# Patient Record
Sex: Male | Born: 1980 | Race: White | Hispanic: No | Marital: Married | Smoking: Former smoker
Health system: Southern US, Community
[De-identification: ages and names within clinical notes are randomized; demographics above are authoritative.]

## PROBLEM LIST (undated history)

## (undated) DIAGNOSIS — J45909 Unspecified asthma, uncomplicated: Secondary | ICD-10-CM

## (undated) DIAGNOSIS — N200 Calculus of kidney: Secondary | ICD-10-CM

---

## 2007-04-12 ENCOUNTER — Emergency Department (HOSPITAL_COMMUNITY): Admission: EM | Admit: 2007-04-12 | Discharge: 2007-04-13 | Payer: Self-pay | Admitting: Emergency Medicine

## 2007-04-14 ENCOUNTER — Emergency Department (HOSPITAL_COMMUNITY): Admission: EM | Admit: 2007-04-14 | Discharge: 2007-04-14 | Payer: Self-pay | Admitting: Emergency Medicine

## 2007-05-29 ENCOUNTER — Emergency Department (HOSPITAL_COMMUNITY): Admission: EM | Admit: 2007-05-29 | Discharge: 2007-05-29 | Payer: Self-pay | Admitting: Emergency Medicine

## 2008-02-08 ENCOUNTER — Emergency Department (HOSPITAL_COMMUNITY): Admission: EM | Admit: 2008-02-08 | Discharge: 2008-02-08 | Payer: Self-pay | Admitting: Emergency Medicine

## 2009-06-23 ENCOUNTER — Emergency Department (HOSPITAL_COMMUNITY): Admission: EM | Admit: 2009-06-23 | Discharge: 2009-06-23 | Payer: Self-pay | Admitting: Emergency Medicine

## 2009-07-06 ENCOUNTER — Emergency Department (HOSPITAL_COMMUNITY): Admission: EM | Admit: 2009-07-06 | Discharge: 2009-07-06 | Payer: Self-pay | Admitting: Emergency Medicine

## 2010-06-03 ENCOUNTER — Emergency Department (HOSPITAL_COMMUNITY): Payer: Self-pay

## 2010-06-03 ENCOUNTER — Emergency Department (HOSPITAL_COMMUNITY)
Admission: EM | Admit: 2010-06-03 | Discharge: 2010-06-03 | Disposition: A | Discharge status: Home / Self Care | Payer: Self-pay | Attending: Emergency Medicine | Admitting: Emergency Medicine

## 2010-06-03 DIAGNOSIS — R0602 Shortness of breath: Secondary | ICD-10-CM | POA: Insufficient documentation

## 2010-06-03 DIAGNOSIS — F411 Generalized anxiety disorder: Secondary | ICD-10-CM | POA: Insufficient documentation

## 2010-11-25 LAB — DIFFERENTIAL
Basophils Absolute: 0
Basophils Relative: 0
Eosinophils Absolute: 0.1
Eosinophils Relative: 1
Neutro Abs: 6.9

## 2010-11-25 LAB — URINALYSIS, ROUTINE W REFLEX MICROSCOPIC
Glucose, UA: 100 — AB
Hgb urine dipstick: NEGATIVE
Protein, ur: NEGATIVE

## 2010-11-25 LAB — COMPREHENSIVE METABOLIC PANEL
Albumin: 3.7
BUN: 15
Calcium: 9
Creatinine, Ser: 0.85
GFR calc Af Amer: >60
GFR calc non Af Amer: >60
Potassium: 4.3
Sodium: 138
Total Bilirubin: 0.9

## 2010-11-25 LAB — CBC
MCHC: 35.2
Platelets: 199
RBC: 4.76
RDW: 11.5

## 2010-11-28 LAB — URINALYSIS, ROUTINE W REFLEX MICROSCOPIC
Glucose, UA: NEGATIVE
pH: 7

## 2010-11-28 LAB — URINE MICROSCOPIC-ADD ON

## 2011-02-19 ENCOUNTER — Emergency Department (HOSPITAL_COMMUNITY)
Admission: EM | Admit: 2011-02-19 | Discharge: 2011-02-20 | Disposition: A | Discharge status: Home / Self Care | Payer: Self-pay | Attending: Emergency Medicine | Admitting: Emergency Medicine

## 2011-02-19 DIAGNOSIS — R109 Unspecified abdominal pain: Secondary | ICD-10-CM | POA: Insufficient documentation

## 2011-02-19 DIAGNOSIS — R300 Dysuria: Secondary | ICD-10-CM | POA: Insufficient documentation

## 2011-02-19 DIAGNOSIS — R110 Nausea: Secondary | ICD-10-CM | POA: Insufficient documentation

## 2011-02-19 DIAGNOSIS — R319 Hematuria, unspecified: Secondary | ICD-10-CM | POA: Insufficient documentation

## 2011-02-19 DIAGNOSIS — R1032 Left lower quadrant pain: Secondary | ICD-10-CM | POA: Insufficient documentation

## 2011-02-19 HISTORY — DX: Calculus of kidney: N20.0

## 2011-02-19 NOTE — ED Notes (Signed)
Pt reports left side flank pain off and on for the past few days.  Pt has hx of kidney stones and states "it feels the same".  Pt denies any hematuria or dysuria.

## 2011-02-20 LAB — URINE MICROSCOPIC-ADD ON

## 2011-02-20 LAB — URINALYSIS, ROUTINE W REFLEX MICROSCOPIC
Ketones, ur: NEGATIVE mg/dL
Leukocytes, UA: NEGATIVE
Protein, ur: NEGATIVE mg/dL
Specific Gravity, Urine: >1.03 — ABNORMAL HIGH (ref 1.005–1.030)

## 2011-02-20 MED ORDER — PROMETHAZINE HCL 25 MG PO TABS: 25.0000 mg | ORAL_TABLET | Freq: Four times a day (QID) | ORAL | Status: AC | PRN

## 2011-02-20 MED ORDER — HYDROMORPHONE HCL PF 1 MG/ML IJ SOLN
1.0000 mg | Freq: Once | INTRAMUSCULAR | Status: AC
  Administered 2011-02-20: 1 mg via INTRAVENOUS
  Filled 2011-02-20: qty 1

## 2011-02-20 MED ORDER — SODIUM CHLORIDE 0.9 % IV SOLN
Freq: Once | INTRAVENOUS | Status: AC
  Administered 2011-02-20: 01:00:00 via INTRAVENOUS

## 2011-02-20 MED ORDER — OXYCODONE-ACETAMINOPHEN 5-325 MG PO TABS: 1.0000 | ORAL_TABLET | ORAL | Status: AC | PRN

## 2011-02-20 MED ORDER — ONDANSETRON HCL 4 MG/2ML IJ SOLN
4.0000 mg | Freq: Once | INTRAMUSCULAR | Status: AC
  Administered 2011-02-20: 4 mg via INTRAVENOUS
  Filled 2011-02-20: qty 2

## 2011-02-20 NOTE — ED Provider Notes (Signed)
Medical screening examination/treatment/procedure(s) were performed by non-physician practitioner and as supervising physician I was immediately available for consultation/collaboration.  Flint Melter, MD 02/20/11 2116

## 2011-02-20 NOTE — Discharge Instructions (Signed)
Flank Pain Flank pain refers to pain that is located on the side of the body between the upper abdomen and the back. It can be caused by many things. CAUSES  Some of the more common causes of flank pain include:  Muscle strain.   Muscle spasms.   A disease of your spine (vertebral disk disease).   A lung infection (pneumonia).   Fluid around your lungs (pulmonary edema).   A kidney infection.   Kidney stones.   A very painful skin rash on only one side of your body (shingles).   Gallbladder disease.  DIAGNOSIS  Blood tests, urine tests, and X-rays may help your caregiver determine what is wrong. TREATMENT  The treatment of pain depends on the cause. Your caregiver will determine what treatment will work best for you. HOME CARE INSTRUCTIONS   Home care will depend on the cause of your pain.   Some medications may help relieve the pain. Take medication for relief of pain as directed by your caregiver.   Tell your caregiver about any changes in your pain.   Follow up with your caregiver.  SEEK IMMEDIATE MEDICAL CARE IF:   Your pain is not controlled with medication.   The pain increases.   You have abdominal pain.   You have shortness of breath.   You have persistent nausea or vomiting.   You have swelling in your abdomen.   You feel faint or pass out.   You have a temperature by mouth above 102 F (38.9 C), not controlled by medicine.  MAKE SURE YOU:   Understand these instructions.   Will watch your condition.   Will get help right away if you are not doing well or get worse.  Document Released: 04/13/2005 Document Revised: 11/02/2010 Document Reviewed: 08/07/2009 [facility] Patient Information 2012 [place], [place].Hematuria, Adult Hematuria (blood in your urine) can be caused by a bladder infection (cystitis), kidney infection (pyelonephritis), prostate infection (prostatitis), or kidney stone. Infections will usually respond to antibiotics (medications  which kill germs), and a kidney stone will usually pass through your urine without further treatment. If you were put on antibiotics, take all the medicine until gone. You may feel better in a few days, but take all of your medicine or the infection may not respond and become more difficult to treat. If antibiotics were not given, an infection did not cause the blood in the urine. A further work up to find out the reason may be needed. HOME CARE INSTRUCTIONS   Drink lots of fluid, 3 to 4 quarts a day. If you have been diagnosed with an infection, cranberry juice is especially recommended, in addition to large amounts of water.   Avoid caffeine, tea, and carbonated beverages, because they tend to irritate the bladder.   Avoid alcohol as it may irritate the prostate.   Only take over-the-counter or prescription medicines for pain, discomfort, or fever as directed by your caregiver.   If you have been diagnosed with a kidney stone follow your caregivers instructions regarding straining your urine to catch the stone.  TO PREVENT FURTHER INFECTIONS:  Empty the bladder often. Avoid holding urine for long periods of time.   After a bowel movement, women should cleanse front to back. Use each tissue only once.   Empty the bladder before and after sexual intercourse if you are a male.   Return to your caregiver if you develop back pain, fever, nausea (feeling sick to your stomach), vomiting, or your symptoms (problems) are  not better in 3 days. Return sooner if you are getting worse.  If you have been requested to return for further testing make sure to keep your appointments. If an infection is not the cause of blood in your urine, X-rays may be required. Your caregiver will discuss this with you. SEEK IMMEDIATE MEDICAL CARE IF:   You have a persistent fever over 102 F (38.9 C).   You develop severe vomiting and are unable to keep the medication down.   You develop severe back or abdominal  pain despite taking your medications.   You begin passing a large amount of blood or clots in your urine.   You feel extremely weak or faint, or pass out.  MAKE SURE YOU:   Understand these instructions.   Will watch your condition.   Will get help right away if you are not doing well or get worse.  Document Released: 02/20/2005 Document Revised: 11/02/2010 Document Reviewed: 10/10/2007 [facility] Patient Information 2012 [place], [place].   As discussed,  I suspect you have a kidney stone.  Return for any worsened symptoms as discussed.  Call Dr Jerre Simon for further management of this condition.  You may take the oxycodone prescribed for pain relief.  This will make you drowsy - do not drive within 4 hours of taking this medication.

## 2011-02-20 NOTE — ED Provider Notes (Signed)
History     CSN: [id number] Arrival date & time: 02/19/2011 11:29 PM   First MD Initiated Contact with Patient 02/19/11 2349      Chief Complaint  Patient presents with  . Flank Pain    (Consider location/radiation/quality/duration/timing/severity/associated sxs/prior treatment) Patient is a 30 y.o. male presenting with flank pain. The history is provided by the patient.  Flank Pain This is a recurrent problem. Episode onset: 3 days ago. The problem occurs intermittently. The problem has been unchanged. Associated symptoms include nausea. Pertinent negatives include no abdominal pain, arthralgias, chest pain, chills, congestion, fever, headaches, joint swelling, neck pain, numbness, rash, sore throat, vomiting or weakness. Associated symptoms comments: He denies hematuria,  But does report occasional increased pain with urination.  Has a history of kidney stones,  Which resemble his symptoms today . The symptoms are aggravated by nothing. He has tried nothing for the symptoms.    Past Medical History  Diagnosis Date  . Kidney stones     History reviewed. No pertinent past surgical history.  No family history on file.  History  Substance Use Topics  . Smoking status: Never Smoker   . Smokeless tobacco: Not on file  . Alcohol Use: Yes      Review of Systems  Constitutional: Negative for fever and chills.  HENT: Negative for congestion, sore throat and neck pain.   Eyes: Negative.   Respiratory: Negative for chest tightness and shortness of breath.   Cardiovascular: Negative for chest pain.  Gastrointestinal: Positive for nausea. Negative for vomiting and abdominal pain.  Genitourinary: Positive for dysuria and flank pain. Negative for urgency, decreased urine volume and discharge.  Musculoskeletal: Negative for joint swelling and arthralgias.  Skin: Negative.  Negative for rash and wound.  Neurological: Negative for dizziness, weakness, light-headedness, numbness and  headaches.  Hematological: Negative.   Psychiatric/Behavioral: Negative.     Allergies  Review of patient's allergies indicates no known allergies.  Home Medications   Current Outpatient Rx  Name Route Sig Dispense Refill  . OXYCODONE-ACETAMINOPHEN 5-325 MG PO TABS Oral Take 1 tablet by mouth every 4 (four) hours as needed for pain. 20 tablet 0  . PROMETHAZINE HCL 25 MG PO TABS Oral Take 1 tablet (25 mg total) by mouth every 6 (six) hours as needed for nausea. 12 tablet 0    BP 132/84  Pulse 70  Temp(Src) 97.6 F (36.4 C) (Oral)  Resp 16  Ht 5\' 7"  (1.702 m)  Wt 202 lb (91.627 kg)  BMI 31.64 kg/m2  SpO2 97%  Physical Exam  Nursing note and vitals reviewed. Constitutional: He is oriented to person, place, and time. He appears well-developed and well-nourished.  HENT:  Head: Normocephalic and atraumatic.  Eyes: Conjunctivae are normal.  Neck: Normal range of motion.  Cardiovascular: Normal rate, regular rhythm, normal heart sounds and intact distal pulses.   Pulmonary/Chest: Effort normal and breath sounds normal. He has no wheezes.  Abdominal: Soft. Bowel sounds are normal. There is tenderness. There is CVA tenderness.       Left cva tenderness,  No abdominal tenderness.  Pain radiates to left lower abdomen, not worsened with palpation.  Musculoskeletal: Normal range of motion.  Neurological: He is alert and oriented to person, place, and time.  Skin: Skin is warm and dry.  Psychiatric: He has a normal mood and affect.    ED Course  Procedures (including critical care time)  Labs Reviewed  URINALYSIS, ROUTINE W REFLEX MICROSCOPIC - Abnormal; Notable for the  following:    Specific Gravity, Urine >1.030 (*)    Hgb urine dipstick LARGE (*)    All other components within normal limits  URINE MICROSCOPIC-ADD ON - Abnormal; Notable for the following:    Bacteria, UA FEW (*)    All other components within normal limits  LAB REPORT - SCANNED   No results found.   1.  Hematuria   2. Flank pain       MDM  Reviewed previous Ct scan - no kidney stones,  But slight hydro suggestive of recent passage of stone.  Discussed case with Dr. Effie Shy,  No indication to repeat CT scan today. Patient encouraged to strain urine,  Pain and nausea medication given.  Strongly warned to return here if he develops worse pain,  Fever or vomiting.  Also given a referral to Dr. Jerre Simon.        Candis Musa, PA 02/20/11 1415

## 2011-02-20 NOTE — ED Notes (Signed)
Pt alert and oriented with respirations even and unlabored.  NAD at this time.  Discharge instructions and Rx x 2 reviewed with pt and pt verbalized understanding.  Pt ambulated with steady gait to POV.  Patient's wife to transport pt home.

## 2012-05-01 ENCOUNTER — Encounter (HOSPITAL_COMMUNITY): Payer: Self-pay | Admitting: Emergency Medicine

## 2012-05-01 ENCOUNTER — Emergency Department (HOSPITAL_COMMUNITY)
Admission: EM | Admit: 2012-05-01 | Discharge: 2012-05-02 | Disposition: A | Discharge status: Home / Self Care | Payer: Self-pay | Attending: Emergency Medicine | Admitting: Emergency Medicine

## 2012-05-01 DIAGNOSIS — H539 Unspecified visual disturbance: Secondary | ICD-10-CM | POA: Insufficient documentation

## 2012-05-01 DIAGNOSIS — Z87442 Personal history of urinary calculi: Secondary | ICD-10-CM | POA: Insufficient documentation

## 2012-05-01 DIAGNOSIS — T1590XA Foreign body on external eye, part unspecified, unspecified eye, initial encounter: Secondary | ICD-10-CM | POA: Insufficient documentation

## 2012-05-01 DIAGNOSIS — T1500XA Foreign body in cornea, unspecified eye, initial encounter: Secondary | ICD-10-CM | POA: Insufficient documentation

## 2012-05-01 DIAGNOSIS — Y9389 Activity, other specified: Secondary | ICD-10-CM | POA: Insufficient documentation

## 2012-05-01 DIAGNOSIS — H5789 Other specified disorders of eye and adnexa: Secondary | ICD-10-CM | POA: Insufficient documentation

## 2012-05-01 DIAGNOSIS — J45909 Unspecified asthma, uncomplicated: Secondary | ICD-10-CM | POA: Insufficient documentation

## 2012-05-01 DIAGNOSIS — IMO0002 Reserved for concepts with insufficient information to code with codable children: Secondary | ICD-10-CM | POA: Insufficient documentation

## 2012-05-01 DIAGNOSIS — Y929 Unspecified place or not applicable: Secondary | ICD-10-CM | POA: Insufficient documentation

## 2012-05-01 HISTORY — DX: Unspecified asthma, uncomplicated: J45.909

## 2012-05-01 MED ORDER — TETRACAINE HCL 0.5 % OP SOLN
1.0000 [drp] | Freq: Once | OPHTHALMIC | Status: AC
  Administered 2012-05-01: 1 [drp] via OPHTHALMIC
  Filled 2012-05-01: qty 2

## 2012-05-01 MED ORDER — FLUORESCEIN SODIUM 1 MG OP STRP
ORAL_STRIP | OPHTHALMIC | Status: AC
  Administered 2012-05-02
  Filled 2012-05-01: qty 1

## 2012-05-01 NOTE — ED Notes (Signed)
Patient reports was using a grinding tool to cut through metal, reports noticed eye was itching once he was finished. Reports he started noticing vision was blurry in left eye. Reports he also feels foreign body in eye.

## 2012-05-01 NOTE — ED Provider Notes (Signed)
History     CSN: [id number]  Arrival date & time 05/01/12  2249   None     Chief Complaint  Patient presents with  . Eye Injury  . Foreign Body in Eye    (Consider location/radiation/quality/duration/timing/severity/associated sxs/prior Treatment)  HPI Ronald Randall is a 32 y.o. male who presents to the ED with foreign body to the left eye. He state that he was grinding metal today and something flew in eye. He was able to see what he thinks is a piece of metal in his eye. He describes the pain in the left eye as gravitating. He was wearing his safety glasses. He denies any other problems.    Past Medical History  Diagnosis Date  . Kidney stones   . Asthma     History reviewed. No pertinent past surgical history.  History reviewed. No pertinent family history.  History  Substance Use Topics  . Smoking status: Never Smoker   . Smokeless tobacco: Not on file  . Alcohol Use: Yes      Review of Systems  Constitutional: Negative for fever, chills and activity change.  HENT: Negative for facial swelling.   Eyes: Positive for pain, redness and visual disturbance.  Respiratory: Negative for cough and wheezing.   Cardiovascular: Negative for chest pain.  Gastrointestinal: Negative for abdominal pain.  Genitourinary: Negative for frequency.  Musculoskeletal: Negative for back pain.  Skin: Negative for rash.  Allergic/Immunologic: Negative for immunocompromised state.  Psychiatric/Behavioral: Negative for confusion. The patient is not nervous/anxious.     Allergies  Review of patient's allergies indicates no known allergies.  Home Medications  No current outpatient prescriptions on file.  BP 158/81  Pulse 88  Temp(Src) 98 F (36.7 C) (Oral)  Resp 18  Ht 5\' 7"  (1.702 m)  Wt 202 lb (91.627 kg)  BMI 31.63 kg/m2  SpO2 100%  Physical Exam  Nursing note and vitals reviewed. Constitutional: He is oriented to person, place, and time. He appears well-developed and  well-nourished.  HENT:  Head: Normocephalic and atraumatic.  Eyes: EOM are normal. Pupils are equal, round, and reactive to light. Foreign body present in the left eye. Left conjunctiva is injected.  Slit lamp exam:      The left eye shows corneal abrasion, foreign body and fluorescein uptake.    Several tiny slivers noted on cornea.  Neck: Neck supple.  Cardiovascular: Normal rate.   Pulmonary/Chest: Effort normal. No respiratory distress.  Musculoskeletal: Normal range of motion. He exhibits no edema.  Neurological: He is alert and oriented to person, place, and time. No cranial nerve deficit.  Skin: Skin is warm and dry.  Psychiatric: He has a normal mood and affect. His behavior is normal. Judgment and thought content normal.   Procedures After anesthetizing with tetracaine and staining with flori-strip, using a sterile cotton tip applicator Dr. Patria Mane was able to remove the foreign bodies from the cornea.  Patient tolerated the procedure well.   Assessment: 32 y.o. male with foreign bodies to the left eye   Corneal abrasion   Plan:  Pain management   Tobramycin opth drops   Follow up with Opthalmology Discussed with the patient and all questioned fully answered. He will return if any problems arise.    Medication List    TAKE these medications       oxyCODONE-acetaminophen 5-325 MG per tablet  Commonly known as:  PERCOCET/ROXICET  Take 1 tablet by mouth every 4 (four) hours as needed for pain.  [address] [place], [place] 05/02/12 [phone]

## 2012-05-02 MED ORDER — OXYCODONE-ACETAMINOPHEN 5-325 MG PO TABS: 1.0000 | ORAL_TABLET | ORAL | Status: DC | PRN

## 2012-05-02 MED ORDER — TOBRAMYCIN 0.3 % OP SOLN
2.0000 [drp] | OPHTHALMIC | Status: DC
  Administered 2012-05-02: 2 [drp] via OPHTHALMIC
  Filled 2012-05-02: qty 5

## 2012-05-02 NOTE — ED Provider Notes (Signed)
Medical screening examination/treatment/procedure(s) were performed by non-physician practitioner and as supervising physician I was immediately available for consultation/collaboration.   Ronald Randall. Oletta Lamas, MD 05/02/12 9811

## 2012-08-18 ENCOUNTER — Encounter (HOSPITAL_COMMUNITY)
Admission: EM | Disposition: A | Discharge status: Home / Self Care | Payer: Self-pay | Source: Home / Self Care | Attending: Emergency Medicine

## 2012-08-18 ENCOUNTER — Encounter (HOSPITAL_COMMUNITY): Payer: Self-pay | Admitting: Anesthesiology

## 2012-08-18 ENCOUNTER — Emergency Department (HOSPITAL_COMMUNITY): Payer: Self-pay | Admitting: Anesthesiology

## 2012-08-18 ENCOUNTER — Encounter (HOSPITAL_COMMUNITY): Payer: Self-pay | Admitting: Emergency Medicine

## 2012-08-18 ENCOUNTER — Emergency Department (HOSPITAL_COMMUNITY)
Admission: EM | Admit: 2012-08-18 | Discharge: 2012-08-19 | Disposition: A | Discharge status: Home / Self Care | Payer: Self-pay | Attending: Internal Medicine | Admitting: Internal Medicine

## 2012-08-18 DIAGNOSIS — K449 Diaphragmatic hernia without obstruction or gangrene: Secondary | ICD-10-CM

## 2012-08-18 DIAGNOSIS — K2270 Barrett's esophagus without dysplasia: Secondary | ICD-10-CM | POA: Insufficient documentation

## 2012-08-18 DIAGNOSIS — T18108A Unspecified foreign body in esophagus causing other injury, initial encounter: Secondary | ICD-10-CM

## 2012-08-18 DIAGNOSIS — K208 Other esophagitis: Secondary | ICD-10-CM

## 2012-08-18 DIAGNOSIS — K2960 Other gastritis without bleeding: Secondary | ICD-10-CM

## 2012-08-18 DIAGNOSIS — IMO0002 Reserved for concepts with insufficient information to code with codable children: Secondary | ICD-10-CM | POA: Insufficient documentation

## 2012-08-18 DIAGNOSIS — K2980 Duodenitis without bleeding: Secondary | ICD-10-CM | POA: Insufficient documentation

## 2012-08-18 HISTORY — PX: ESOPHAGOGASTRODUODENOSCOPY (EGD) WITH PROPOFOL: SHX5813

## 2012-08-18 HISTORY — PX: FOREIGN BODY REMOVAL: SHX962

## 2012-08-18 SURGERY — REMOVAL, FOREIGN BODY
Anesthesia: Monitor Anesthesia Care

## 2012-08-18 SURGERY — REMOVAL, FOREIGN BODY
Anesthesia: Moderate Sedation

## 2012-08-18 SURGERY — ESOPHAGOGASTRODUODENOSCOPY (EGD) WITH PROPOFOL
Anesthesia: Monitor Anesthesia Care

## 2012-08-18 MED ORDER — LIDOCAINE HCL (CARDIAC) 10 MG/ML IV SOLN
INTRAVENOUS | Status: DC | PRN
  Administered 2012-08-18: 50 mg via INTRAVENOUS

## 2012-08-18 MED ORDER — OMEPRAZOLE MAGNESIUM 20 MG PO TBEC: 20.0000 mg | DELAYED_RELEASE_TABLET | Freq: Two times a day (BID) | ORAL | Status: DC

## 2012-08-18 MED ORDER — FENTANYL CITRATE 0.05 MG/ML IJ SOLN
INTRAMUSCULAR | Status: DC | PRN
  Administered 2012-08-18: 25 ug via INTRAVENOUS

## 2012-08-18 MED ORDER — MEPERIDINE HCL 25 MG/ML IJ SOLN
INTRAMUSCULAR | Status: DC | PRN
  Administered 2012-08-18 (×2): 25 mg via INTRAVENOUS

## 2012-08-18 MED ORDER — BUTAMBEN-TETRACAINE-BENZOCAINE 2-2-14 % EX AERO
INHALATION_SPRAY | CUTANEOUS | Status: DC | PRN
  Administered 2012-08-18 (×2): 2 via TOPICAL

## 2012-08-18 MED ORDER — STERILE WATER FOR IRRIGATION IR SOLN
Status: DC | PRN
  Administered 2012-08-18: 22:00:00

## 2012-08-18 MED ORDER — PROMETHAZINE HCL 25 MG/ML IJ SOLN
INTRAMUSCULAR | Status: DC | PRN
  Administered 2012-08-18: 25 mg via INTRAVENOUS

## 2012-08-18 MED ORDER — LACTATED RINGERS IV SOLN
INTRAVENOUS | Status: DC | PRN
  Administered 2012-08-18: 23:00:00 via INTRAVENOUS

## 2012-08-18 MED ORDER — GLUCAGON HCL (RDNA) 1 MG IJ SOLR
1.0000 mg | Freq: Once | INTRAMUSCULAR | Status: AC
  Administered 2012-08-18: 1 mg via INTRAVENOUS
  Filled 2012-08-18: qty 1

## 2012-08-18 MED ORDER — MIDAZOLAM HCL 5 MG/5ML IJ SOLN
INTRAMUSCULAR | Status: DC | PRN
  Administered 2012-08-18: 3 mg via INTRAVENOUS
  Administered 2012-08-18: 2 mg via INTRAVENOUS
  Administered 2012-08-18: 3 mg via INTRAVENOUS
  Administered 2012-08-18: 2 mg via INTRAVENOUS

## 2012-08-18 MED ORDER — SODIUM CHLORIDE 0.9 % IV SOLN
INTRAVENOUS | Status: DC
  Administered 2012-08-18: 20:00:00 via INTRAVENOUS

## 2012-08-18 MED ORDER — PROPOFOL INFUSION 10 MG/ML OPTIME
INTRAVENOUS | Status: DC | PRN
  Administered 2012-08-18: 75 ug/kg/min via INTRAVENOUS

## 2012-08-18 SURGICAL SUPPLY — 9 items
BLOCK BITE 60FR ADLT L/F BLUE (MISCELLANEOUS) ×2 IMPLANT
FLOOR PAD 36X40 (MISCELLANEOUS) ×2
MANIFOLD NEPTUNE II (INSTRUMENTS) ×2 IMPLANT
PAD FLOOR 36X40 (MISCELLANEOUS) ×1 IMPLANT
SNARE ROTATE MED OVAL 20MM (MISCELLANEOUS) ×2 IMPLANT
SPONGE GAUZE 4X4 12PLY (GAUZE/BANDAGES/DRESSINGS) ×2 IMPLANT
TUBING ENDO SMARTCAP PENTAX (MISCELLANEOUS) ×2 IMPLANT
TUBING IRRIGATION ENDOGATOR (MISCELLANEOUS) ×2 IMPLANT
WATER STERILE IRR 1000ML POUR (IV SOLUTION) ×2 IMPLANT

## 2012-08-18 NOTE — Op Note (Signed)
EGD PROCEDURE REPORT  PATIENT:  Ronald Randall  MR#:  [id number] Birthdate:  01/31/1981, 32 y.o., male Endoscopist:  Dr. Malissa Hippo, MD Referred By:  Dr. Samuel Jester, MD Procedure Date: 08/18/2012  Procedure:   EGD  Indications:  Patient is 32 year old Caucasian male who presents with signs and symptoms of esophageal foreign body. He also reports chronic heartburn. He has had intermittent dysphagia for several years.           Informed Consent:  The risks, benefits, alternatives & imponderables which include, but are not limited to, bleeding, infection, perforation, drug reaction and potential missed lesion have been reviewed.  The potential for biopsy, lesion removal, esophageal dilation, etc. have also been discussed.  Questions have been answered.  All parties agreeable.  Please see history & physical in medical record for more information.  Medications:  Demerol 50 mg IV Versed 10 mg IV Cetacaine spray topically for oropharyngeal anesthesia Propofol administered by anesthesia team.  Description of procedure: The procedure was initially attempted an endoscopy suite following conscious sedation but no sooner the scope was passed into the esophagus patient became restless and combative and procedure could not be completed. He had air fluid level in in the esophagus indicative of distal obstruction. Procedure was then completed with propofol administered under supervision of anesthesia team. Findings:  Esophagus:  Mucosa of the proximal segment was normal. Extensive esophageal ulceration noted starting at 24 cm with luminal narrowing but no resistance noted on passing the scope. There was tubular segment of salmon-colored mucosa least 3 cm in length. There was small amount of food debris in the distal esophagus which passed distally spontaneously. GEJ:  35 cm Hiatus:  38 cm Stomach:  Small amount of food debris noted in the gastric body. Stomach distended very well with insufflation.  Folds in the proximal stomach were normal. Examination mucosa at body, antrum, pyloric channel, angularis fundus and cardia was normal. Duodenum:  Multiple bulbar erosions noted. Post bulbar mucosa was normal.  Therapeutic/Diagnostic Maneuvers Performed:  None  Complications:  None  Impression: Foreign body spontaneously moved distally into the stomach. Extensive ulceration to mucosa admitted esophagus with no luminal narrowing. Barrett's mucosa. Length could not be estimated because of ulceration. Small sliding hiatal hernia. Erosive bulbar duodenitis  Recommendations:  H. pylori serology. Prilosec OTC 20 mg by mouth twice a day. Office visit in one month. He will need elective EGD with biopsy under endotracheal anesthesia in 8-12 weeks.  REHMAN,NAJEEB U  08/18/2012  11:42 PM  CC: Dr. No primary provider on file. & Dr. No ref. provider found

## 2012-08-18 NOTE — Anesthesia Postprocedure Evaluation (Signed)
  Anesthesia Post-op Note  Patient: Ronald Randall  Procedure(s) Performed: Procedure(s): ESOPHAGOGASTRODUODENOSCOPY (EGD) WITH PROPOFOL (N/A) FOREIGN BODY REMOVAL (N/A)  Patient Location: PACU  Anesthesia Type:MAC  Level of Consciousness: awake, alert , oriented and patient cooperative  Airway and Oxygen Therapy: Patient Spontanous Breathing  Post-op Pain: mild  Post-op Assessment: Post-op Vital signs reviewed, Patient's Cardiovascular Status Stable, Respiratory Function Stable, Patent Airway and No signs of Nausea or vomiting  Post-op Vital Signs: Reviewed and stable  Complications: No apparent anesthesia complications

## 2012-08-18 NOTE — Anesthesia Procedure Notes (Signed)
Procedure Name: MAC Date/Time: 08/18/2012 11:49 PM Performed by: Carolyne Littles, AMY L Pre-anesthesia Checklist: Patient identified, Emergency Drugs available, Suction available, Patient being monitored and Timeout performed Oxygen Delivery Method: Non-rebreather mask

## 2012-08-18 NOTE — ED Notes (Signed)
Pt continues to spit saliva into emesis bag. Respiratory status remains stable.

## 2012-08-18 NOTE — Transfer of Care (Signed)
Immediate Anesthesia Transfer of Care Note  Patient: Ronald Randall  Procedure(s) Performed: Procedure(s): ESOPHAGOGASTRODUODENOSCOPY (EGD) WITH PROPOFOL (N/A) FOREIGN BODY REMOVAL (N/A)  Patient Location: PACU  Anesthesia Type:MAC  Level of Consciousness: sedated and patient cooperative  Airway & Oxygen Therapy: Patient Spontanous Breathing  Post-op Assessment: Report given to PACU RN and Post -op Vital signs reviewed and stable  Post vital signs: Reviewed and stable  Complications: No apparent anesthesia complications

## 2012-08-18 NOTE — ED Provider Notes (Signed)
History     CSN: [id number]  Arrival date & time 08/18/12  1858   First MD Initiated Contact with Patient 08/18/12 1902      Chief Complaint  Patient presents with  . Swallowed Foreign Body     HPI Pt was seen at 1910. Per pt, c/o sudden onset and persistence of constant esophageal FB that began approx 1 hour PTA. Pt states he was eating a piece of steak when he felt like it became "stuck" in his lower throat.  Pt states he has been unable to swallow his oral secretions or water without regurgitating them. Pt states has a hx of similar symptoms for the past "14 years." Pt states he "usually can just push the food down by drinking a lot of water."  Pt states he has never been eval by a GI MD for same. Denies SOB, no wheezing/stridor, no CP, no abd pain, no hematemesis.       Past Medical History  Diagnosis Date  . Kidney stones   . Asthma     History reviewed. No pertinent past surgical history.    History  Substance Use Topics  . Smoking status: Never Smoker   . Smokeless tobacco: Not on file  . Alcohol Use: Yes     Comment: occasional      Review of Systems ROS: Statement: All systems negative except as marked or noted in the HPI; Constitutional: Negative for fever and chills. ; ; Eyes: Negative for eye pain, redness and discharge. ; ; ENMT: Negative for ear pain, hoarseness, nasal congestion, sinus pressure and sore throat. ; ; Cardiovascular: Negative for chest pain, palpitations, diaphoresis, dyspnea and peripheral edema. ; ; Respiratory: Negative for cough, wheezing and stridor. ; ; Gastrointestinal: +esophageal FB, N/V. Negative for diarrhea, abdominal pain, blood in stool, hematemesis, jaundice and rectal bleeding. . ; ; Genitourinary: Negative for dysuria, flank pain and hematuria. ; ; Musculoskeletal: Negative for back pain and neck pain. Negative for swelling and trauma.; ; Skin: Negative for pruritus, rash, abrasions, blisters, bruising and skin lesion.; ; Neuro:  Negative for headache, lightheadedness and neck stiffness. Negative for weakness, altered level of consciousness , altered mental status, extremity weakness, paresthesias, involuntary movement, seizure and syncope.       Allergies  Review of patient's allergies indicates no known allergies.  Home Medications   Current Outpatient Rx  Name  Route  Sig  Dispense  Refill  . oxyCODONE-acetaminophen (PERCOCET/ROXICET) 5-325 MG per tablet   Oral   Take 1 tablet by mouth every 4 (four) hours as needed for pain.   15 tablet   0     BP 136/81  Pulse 93  Temp(Src) 99.3 F (37.4 C) (Oral)  Resp 20  Ht 5\' 7"  (1.702 m)  Wt 203 lb (92.08 kg)  BMI 31.79 kg/m2  SpO2 100%  Physical Exam 1915: Physical examination:  Nursing notes reviewed; Vital signs and O2 SAT reviewed;  Constitutional: Well developed, Well nourished, Well hydrated, Uncomfortable appearing; Head:  Normocephalic, atraumatic; Eyes: EOMI, PERRL, No scleral icterus; ENMT: Mouth and pharynx normal, Mucous membranes moist; Neck: Supple, Full range of motion, No lymphadenopathy; Cardiovascular: Regular rate and rhythm, No murmur, rub, or gallop; Respiratory: Breath sounds clear & equal bilaterally, No rales, rhonchi, wheezes.  Speaking full sentences with ease, Normal respiratory effort/excursion; Chest: Nontender, Movement normal; Abdomen: Soft, Nontender, Nondistended, Normal bowel sounds; Genitourinary: No CVA tenderness; Extremities: Pulses normal, No tenderness, No edema, No calf edema or asymmetry.; Neuro: AA&Ox3, Major  CN grossly intact.  Speech clear. Climbs on and off stretcher easily by himself. Gait steady. No gross focal motor or sensory deficits in extremities.; Skin: Color normal, Warm, Dry.   ED Course  Procedures   1920:  Pt attempted to take a sip of water while in the ED. Pt was able to swallow the water but shortly afterwards regurgitated the water it into the trash bin. Will consult GI MD.   1930:  T/C to GI Dr.  Karilyn Cota, case discussed, including:  HPI, pertinent PM/SHx, VS/PE, dx testing, ED course and treatment:  Requests to give a dose of IV glucagon 1mg  first and call him back.   2015:  Pt continues to c/o FB sensation in lower throat/upper chest despite IV glucagon. Continues to spit his oral secretions into a cup.  Airway remains patent. T/C to GI Dr. Karilyn Cota, case discussed, including:  HPI, pertinent PM/SHx, VS/PE, dx testing, ED course and treatment:  Agreeable to come to hospital for endoscopy. Dx d/w pt.  Questions answered.  Verb understanding, agreeable to eval/treatment by GI MD.     MDM  MDM Reviewed: previous chart, nursing note and vitals            Laray Anger, DO 08/19/12 1402

## 2012-08-18 NOTE — Preoperative (Signed)
Beta Blockers   Reason not to administer Beta Blockers:Not Applicable 

## 2012-08-18 NOTE — ED Notes (Signed)
Pt presents with a " piece of steak in throat".  Pt is in no respiratory distress and denies SOB.  Pt attempted to drink water and preceded to vomit promptly. EDP at bedside.

## 2012-08-18 NOTE — Anesthesia Preprocedure Evaluation (Addendum)
Anesthesia Evaluation  Patient identified by MRN, date of birth, ID band Patient awake    Reviewed: Allergy & Precautions, H&P , NPO status , Patient's Chart, lab work & pertinent test results  Airway Mallampati: II  Neck ROM: Full    Dental no notable dental hx. (+) Teeth Intact   Pulmonary asthma ,  breath sounds clear to auscultation        Cardiovascular Rhythm:Regular Rate:Normal     Neuro/Psych    GI/Hepatic   Endo/Other    Renal/GU      Musculoskeletal   Abdominal   Peds  Hematology   Anesthesia Other Findings   Reproductive/Obstetrics                         Anesthesia Physical Anesthesia Plan  ASA: I and emergent  Anesthesia Plan: MAC   Post-op Pain Management:    Induction: Intravenous  Airway Management Planned:   Additional Equipment:   Intra-op Plan:   Post-operative Plan:   Informed Consent: I have reviewed the patients History and Physical, chart, labs and discussed the procedure including the risks, benefits and alternatives for the proposed anesthesia with the patient or authorized representative who has indicated his/her understanding and acceptance.   Dental advisory given  Plan Discussed with: Anesthesiologist and Surgeon  Anesthesia Plan Comments:         Anesthesia Quick Evaluation

## 2012-08-18 NOTE — H&P (Signed)
Ronald Randall is an 32 y.o. male.   Chief Complaint: Patient's here for EGD and foreign body removal. HPI: Patient is 32 year old Caucasian male is history of intermittent solid food dysphagia for several years. He also has frequent heartburn and takes OTC medications. He presented to the emergency room this evening with complains of unable to swallow food or saliva. Patient felt to have foreign body esophagus. He did not respond to glucagon. He is therefore undergoing therapeutic procedure.  He has been using OTC medications for heartburn. He denies anorexia weight loss or melena.  Past Medical History  Diagnosis Date  . Kidney stones   . Asthma     History reviewed. No pertinent past surgical history.  History reviewed. No pertinent family history. Social History:  reports that he quit smoking about 8 weeks ago. He does not have any smokeless tobacco history on file. He reports that  drinks alcohol. He reports that he does not use illicit drugs.  Allergies:  Allergies  Allergen Reactions  . Fish Allergy Nausea And Vomiting      No results found for this or any previous visit (from the past 48 hour(s)). No results found.  ROS  Blood pressure 136/81, pulse 78, temperature 98.7 F (37.1 C), temperature source Oral, resp. rate 15, height 5\' 7"  (1.702 m), weight 203 lb (92.08 kg), SpO2 100.00%. Physical Exam  Constitutional: He appears well-developed and well-nourished.  HENT:  Mouth/Throat: Oropharynx is clear and moist.  Eyes: Conjunctivae are normal. No scleral icterus.  Neck: No thyromegaly present.  Cardiovascular: Normal rate, regular rhythm and normal heart sounds.   No murmur heard. Respiratory: Effort normal and breath sounds normal.  GI: Soft. He exhibits no distension and no mass. There is no tenderness.  Musculoskeletal: He exhibits no edema.  Lymphadenopathy:    He has no cervical adenopathy.  Neurological: He is alert.  Skin: Skin is warm and dry.      Assessment/Plan Foreign body esophagus.  EGD with FB removal and possible esophageal dilation.  RonaldNAJEEB Randall 08/18/2012, 9:41 PM

## 2012-08-18 NOTE — Progress Notes (Signed)
EGD attempted with conscious sedation but patient unable to tolerate the procedure. He has air-fluid level in his esophagus indicative of distal obstruction. Patient became restless and uncooperative. Therefore endoscope was withdrawn. Will need anesthesia help for procedure could be completed. Patient's wife informed of plan

## 2012-08-18 NOTE — ED Notes (Signed)
Pt transported to edno via wheel chair.

## 2012-08-18 NOTE — ED Notes (Signed)
Patient reports was eating steak approximately an hour ago and feels as if a piece is stuck in his throat. Reports unable to swallow and has to spit out his saliva. Denies breathing difficulty.

## 2012-08-18 NOTE — Progress Notes (Signed)
From OR. Oriented to place per nurse. Swallowing without difficulty.

## 2012-08-19 MED ORDER — STERILE WATER FOR IRRIGATION IR SOLN
Status: DC | PRN
  Administered 2012-08-18

## 2012-08-19 NOTE — Progress Notes (Signed)
Dr Rehman at bedside to talk with pt. Cleared for d/c home. 

## 2012-08-19 NOTE — Progress Notes (Signed)
D/C instructions reviewed and discussed with wife and pt. Voiced understanding. Escorted to ER. Discharged to home in good condition.

## 2012-08-19 NOTE — Progress Notes (Signed)
In post-op room 1. Wife at side. Pt becoming uncooperative. Wants to get clothes on and go home. Explained to pt need to obtain vital signs before he dresses and goes home, and need to d/c IV before he dresses. Continues to dress while I obtain vital signs and d/c IV. Explained to pt that Dr Karilyn Cota has ordered lab tests, h-pylori, to be drawn. Pt refuses to have this done. Dr Karilyn Cota notified. No new orders given.

## 2012-08-19 NOTE — Progress Notes (Signed)
Awake. Wants to go home. Becoming agitated. Uncooperative. "I can do it all myself. I am totally with it."

## 2012-08-23 ENCOUNTER — Encounter (HOSPITAL_COMMUNITY): Payer: Self-pay | Admitting: Internal Medicine

## 2014-03-19 ENCOUNTER — Encounter (HOSPITAL_COMMUNITY): Payer: Self-pay | Admitting: Internal Medicine

## 2014-11-20 ENCOUNTER — Other Ambulatory Visit (HOSPITAL_COMMUNITY): Payer: Self-pay | Admitting: Physician Assistant

## 2014-11-20 DIAGNOSIS — N5089 Other specified disorders of the male genital organs: Secondary | ICD-10-CM

## 2014-11-21 ENCOUNTER — Other Ambulatory Visit (HOSPITAL_COMMUNITY): Payer: Self-pay | Admitting: Physician Assistant

## 2014-11-21 DIAGNOSIS — N5089 Other specified disorders of the male genital organs: Secondary | ICD-10-CM

## 2014-11-24 ENCOUNTER — Ambulatory Visit (HOSPITAL_COMMUNITY): Admission: RE | Admit: 2014-11-24 | Payer: 59 | Source: Ambulatory Visit

## 2014-12-29 ENCOUNTER — Other Ambulatory Visit (HOSPITAL_COMMUNITY): Payer: Self-pay | Admitting: Physician Assistant

## 2014-12-29 ENCOUNTER — Ambulatory Visit (HOSPITAL_COMMUNITY)
Admission: RE | Admit: 2014-12-29 | Discharge: 2014-12-29 | Disposition: A | Discharge status: Home / Self Care | Payer: 59 | Source: Ambulatory Visit | Attending: Physician Assistant | Admitting: Physician Assistant

## 2014-12-29 DIAGNOSIS — M542 Cervicalgia: Secondary | ICD-10-CM

## 2014-12-29 DIAGNOSIS — M25511 Pain in right shoulder: Secondary | ICD-10-CM | POA: Insufficient documentation

## 2015-07-06 ENCOUNTER — Ambulatory Visit (INDEPENDENT_AMBULATORY_CARE_PROVIDER_SITE_OTHER): Payer: BLUE CROSS/BLUE SHIELD | Admitting: Orthopaedic Surgery

## 2015-07-06 ENCOUNTER — Encounter: Payer: Self-pay | Admitting: Orthopaedic Surgery

## 2015-07-06 VITALS — BP 148/86 | HR 88 | Temp 97.9°F | Ht 67.0 in | Wt 196.0 lb

## 2015-07-06 DIAGNOSIS — M25511 Pain in right shoulder: Secondary | ICD-10-CM

## 2015-07-06 MED ORDER — HYDROCODONE-ACETAMINOPHEN 5-325 MG PO TABS: ORAL_TABLET | ORAL | Status: AC

## 2015-07-06 MED ORDER — NAPROXEN 500 MG PO TABS: 500.0000 mg | ORAL_TABLET | Freq: Two times a day (BID) | ORAL | Status: DC

## 2015-07-06 NOTE — Progress Notes (Signed)
Subjective:    Patient ID: Ronald Randall, male    DOB: April 06, 1980, 35 y.o.   MRN: [id number]  Arm Pain  There was no injury mechanism. The pain is present in the right shoulder. The quality of the pain is described as aching. The pain does not radiate. The pain is at a severity of 5/10. The pain is moderate. The pain has been worsening since the incident. Pertinent negatives include no chest pain, muscle weakness, numbness or tingling. The symptoms are aggravated by movement and lifting. He has tried ice, immobilization, heat, NSAIDs, rest and acetaminophen for the symptoms. The treatment provided mild relief.   He has had pain of the right shoulder for about a year, getting worse.  He was seen at [facility] and had x-rays in October of 2016 which were negative.  I have reviewed the x-rays and the report.  He was given an injection which helped a little.  He continues to have pain more with overhead use. 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  He is tired of it hurting and would like something done to get his pain less.  He has pain rolling over on it at night.  He works in [employer].    Review of Systems  HENT: Negative for congestion.   Respiratory: Negative for cough and shortness of breath.   Cardiovascular: Negative for chest pain and leg swelling.  Endocrine: Negative for cold intolerance.  Musculoskeletal: Positive for arthralgias.  Allergic/Immunologic: Negative for environmental allergies.  Neurological: Negative for tingling and numbness.   Past Medical History  Diagnosis Date  . Kidney stones   . Asthma     Past Surgical History  Procedure Laterality Date  . Esophagogastroduodenoscopy (egd) with propofol N/A 08/18/2012    Procedure: ESOPHAGOGASTRODUODENOSCOPY (EGD) WITH PROPOFOL;  Surgeon: Malissa Hippo, MD;  Location: [facility];  Service: Gastroenterology;  Laterality: N/A;  . Foreign body removal N/A 08/18/2012    Procedure: FOREIGN BODY  REMOVAL;  Surgeon: Malissa Hippo, MD;  Location: [facility];  Service: Gastroenterology;  Laterality: N/A;    No current outpatient prescriptions on file prior to visit.   No current facility-administered medications on file prior to visit.    Social History   Social History  . Marital Status: Married    Spouse Name: N/A  . Number of Children: N/A  . Years of Education: N/A   Occupational History  . Not on file.   Social History Main Topics  . Smoking status: Former Smoker    Quit date: 06/23/2012  . Smokeless tobacco: Not on file  . Alcohol Use: Yes     Comment: occasional  . Drug Use: No  . Sexual Activity: Not on file   Other Topics Concern  . Not on file   Social History Narrative    BP 148/86 mmHg  Pulse 88  Temp(Src) 97.9 F (36.6 C)  Ht  (1.702 m)  Wt 196 lb (88.905 kg)  BMI 30.69 kg/m2     Objective:   Physical Exam  Constitutional: He is oriented to person, place, and time. He appears well-developed and well-nourished.  HENT:  Head: Normocephalic and atraumatic.  Eyes: Conjunctivae and EOM are normal. Pupils are equal, round, and reactive to light.  Neck: Normal range of motion. Neck supple.  Cardiovascular: Normal rate, regular rhythm and intact distal pulses.   Pulmonary/Chest: Effort normal.  Abdominal: Soft.  Musculoskeletal: He exhibits tenderness (He has full motion of  the right shoulder but more pain with overhead use and with resisted abduction.  There is nor redness or crepitus.  NV intact.  Left shoulder negative.).  Neurological: He is alert and oriented to person, place, and time. He has normal reflexes. No cranial nerve deficit. He exhibits normal muscle tone. Coordination normal.  Skin: Skin is warm and dry.  Psychiatric: He has a normal mood and affect. His behavior is normal. Judgment and thought content normal.          Assessment & Plan:   Encounter Diagnosis  Name Primary?  . Right shoulder pain Yes   PROCEDURE  NOTE:  The patient request injection, verbal consent was obtained.  The right shoulder was prepped appropriately after time out was performed.   Sterile technique was observed and injection of 1 cc of Depo-Medrol 40 mg with several cc's of plain xylocaine. Anesthesia was provided by ethyl chloride and a 20-gauge needle was used to inject the shoulder area. A posterior approach was used.  The injection was tolerated well.  A band aid dressing was applied.  The patient was advised to apply ice later today and tomorrow to the injection sight as needed.  I will begin Norco 5 and Naprosyn.  Precautions given.  Return in two weeks.  If not improved, consider MRI.  Call if any problem.  Precautions discussed.

## 2015-07-20 ENCOUNTER — Encounter: Payer: Self-pay | Admitting: Orthopaedic Surgery

## 2015-07-20 ENCOUNTER — Ambulatory Visit (INDEPENDENT_AMBULATORY_CARE_PROVIDER_SITE_OTHER): Payer: BLUE CROSS/BLUE SHIELD | Admitting: Orthopaedic Surgery

## 2015-07-20 VITALS — BP 132/79 | HR 80 | Temp 98.1°F | Ht 67.0 in | Wt 196.0 lb

## 2015-07-20 DIAGNOSIS — M25511 Pain in right shoulder: Secondary | ICD-10-CM

## 2015-07-20 NOTE — Progress Notes (Signed)
Patient ZO:XWRUE:Ronald Randall, male DOB:06/06/1980, 35 y.o. AVW:[id number]  Chief Complaint  Patient presents with  . Follow-up    Right shoulder pain    HPI  Ronald Randall is a 35 y.o. male who continues to have right shoulder pain.  He is only a little better on the Naprosyn.  He did not like the pain medicine and stopped it.  He still has pain in the right shoulder with use.  He has some popping at times.  He has no paresthesias or weakness.  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  He has pain rolling on the shoulder at night.  [facility], documented by their notes.  I will get a MRI of the right shoulder.    HPI  Body mass index is 30.69 kg/(m^2).  Review of Systems  HENT: Negative for congestion.   Respiratory: Negative for cough and shortness of breath.   Cardiovascular: Negative for chest pain and leg swelling.  Endocrine: Negative for cold intolerance.  Musculoskeletal: Positive for arthralgias.  Allergic/Immunologic: Negative for environmental allergies.  Neurological: Negative for numbness.    Past Medical History  Diagnosis Date  . Kidney stones   . Asthma     Past Surgical History  Procedure Laterality Date  . Esophagogastroduodenoscopy (egd) with propofol N/A 08/18/2012    Procedure: ESOPHAGOGASTRODUODENOSCOPY (EGD) WITH PROPOFOL;  Surgeon: Malissa HippoNajeeb U Rehman, MD;  Location: [facility];  Service: Gastroenterology;  Laterality: N/A;  . Foreign body removal N/A 08/18/2012    Procedure: FOREIGN BODY REMOVAL;  Surgeon: Malissa HippoNajeeb U Rehman, MD;  Location: [facility];  Service: Gastroenterology;  Laterality: N/A;    History reviewed. No pertinent family history.  Social History Social History  Substance Use Topics  . Smoking status: Former Smoker    Quit date: 06/23/2012  . Smokeless tobacco: None  . Alcohol Use: Yes     Comment: occasional    Allergies  Allergen Reactions  . Fish Allergy Nausea And  Vomiting    Current Outpatient Prescriptions  Medication Sig Dispense Refill  . HYDROcodone-acetaminophen (NORCO/VICODIN) 5-325 MG tablet One by mouth every four hours as needed for pain.  Must last 15 days.  Do no drive a car when on this medicine. 60 tablet 0  . naproxen (NAPROSYN) 500 MG tablet Take 1 tablet (500 mg total) by mouth 2 (two) times daily with a meal. 60 tablet 5   No current facility-administered medications for this visit.     Physical Exam  Blood pressure 132/79, pulse 80, temperature 98.1 F (36.7 C), height 5\' 7"  (1.702 m), weight 196 lb (88.905 kg).  Constitutional: overall normal hygiene, normal nutrition, well developed, normal grooming, normal body habitus. Assistive device:none  Musculoskeletal: gait and station Limp none, muscle tone and strength are normal, no tremors or atrophy is present.  .  Neurological: coordination overall normal.  Deep tendon reflex/nerve stretch intact.  Sensation normal.  Cranial nerves II-XII intact.   Skin:   normal overall no scars, lesions, ulcers or rashes. No psoriasis.  Psychiatric: Alert and oriented x 3.  Recent memory intact, remote memory unclear.  Normal mood and affect. Well groomed.  Good eye contact.  Cardiovascular: overall no swelling, no varicosities, no edema bilaterally, normal temperatures of the legs and arms, no clubbing, cyanosis and good capillary refill.  Lymphatic: palpation is normal.  Examination of right Upper Extremity is done.  Inspection:   Overall:  Elbow non-tender without crepitus or defects, forearm non-tender without crepitus or defects, wrist non-tender without crepitus or defects, hand non-tender.    Shoulder: with glenohumeral joint tenderness, without effusion.   Upper arm: without swelling and tenderness   Range of motion:   Overall:  Full range of motion of the elbow, full range of motion of wrist and full range of motion in fingers.   Shoulder:  right  140 degrees forward  flexion; 90 degrees abduction; 35 degrees internal rotation, 35 degrees external rotation, 15 degrees extension, 40 degrees adduction.   Stability:   Overall:  Shoulder, elbow and wrist stable   Strength and Tone:   Overall full shoulder muscles strength, full upper arm strength and normal upper arm bulk and tone.  Left shoulder normal.  The patient has been educated about the nature of the problem(s) and counseled on treatment options.  The patient appeared to understand what I have discussed and is in agreement with it.  Encounter Diagnosis  Name Primary?  . Right shoulder pain Yes    PLAN Call if any problems.  Precautions discussed.  Continue current medications.   Return to clinic after MRI of the right shoulder.

## 2015-07-26 ENCOUNTER — Ambulatory Visit (HOSPITAL_COMMUNITY)
Admission: RE | Admit: 2015-07-26 | Discharge: 2015-07-26 | Disposition: A | Discharge status: Home / Self Care | Payer: BLUE CROSS/BLUE SHIELD | Source: Ambulatory Visit | Attending: Orthopaedic Surgery | Admitting: Orthopaedic Surgery

## 2015-07-26 ENCOUNTER — Other Ambulatory Visit: Payer: Self-pay | Admitting: Orthopaedic Surgery

## 2015-07-26 DIAGNOSIS — Z135 Encounter for screening for eye and ear disorders: Secondary | ICD-10-CM | POA: Diagnosis present

## 2015-07-26 DIAGNOSIS — Z01818 Encounter for other preprocedural examination: Secondary | ICD-10-CM

## 2015-07-26 DIAGNOSIS — M25511 Pain in right shoulder: Secondary | ICD-10-CM | POA: Diagnosis not present

## 2015-08-03 ENCOUNTER — Ambulatory Visit: Payer: BLUE CROSS/BLUE SHIELD | Admitting: Orthopaedic Surgery

## 2015-08-04 ENCOUNTER — Encounter: Payer: Self-pay | Admitting: Orthopaedic Surgery

## 2015-08-04 ENCOUNTER — Ambulatory Visit (INDEPENDENT_AMBULATORY_CARE_PROVIDER_SITE_OTHER): Payer: BLUE CROSS/BLUE SHIELD | Admitting: Orthopaedic Surgery

## 2015-08-04 VITALS — BP 128/79 | HR 89 | Temp 97.9°F | Ht 67.0 in | Wt 196.0 lb

## 2015-08-04 DIAGNOSIS — M25511 Pain in right shoulder: Secondary | ICD-10-CM | POA: Diagnosis not present

## 2015-08-04 NOTE — Patient Instructions (Addendum)
Continue medications. MRI results were discussed.  Begin physical therapy. Instructed to purchase a type of muscle cream Ex: Aspercream with lidocaine and use it on his shoulder.

## 2015-08-04 NOTE — Progress Notes (Signed)
Patient JX:BJYNW:Ronald Randall, male DOB:03/20/1980, 35 y.o. [id number]  Chief Complaint  Patient presents with  . Results    MRI Right shoulder    HPI  Ronald Randall is a 35 y.o. male who continues to have right shoulder pain.  He has pain with rotation and throwing maneuvers.  He had a MRI of the shoulder it shows:  IMPRESSION: 1. 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  I went over the findings.  He does not need surgery.  I have recommended OT/PT.  He says he has a BowFlex at home and wants to use that.  I will have him go to OT once and get the exercises to do.  I have gone over precautions about overdoing it.  He works [employer] and I went over precautions there as well.  He understands.    He cannot take NSAIDs so I have recommended Aspercreme with Lidocaine.  I explained how to use it.  HPI  Body mass index is 30.69 kg/(m^2).  ROS  Review of Systems  HENT: Negative for congestion.   Respiratory: Negative for cough and shortness of breath.   Cardiovascular: Negative for chest pain and leg swelling.  Endocrine: Negative for cold intolerance.  Musculoskeletal: Positive for arthralgias.  Allergic/Immunologic: Negative for environmental allergies.  Neurological: Negative for numbness.    Past Medical History  Diagnosis Date  . Kidney stones   . Asthma     Past Surgical History  Procedure Laterality Date  . Esophagogastroduodenoscopy (egd) with propofol N/A 08/18/2012    Procedure: ESOPHAGOGASTRODUODENOSCOPY (EGD) WITH PROPOFOL;  Surgeon: Malissa HippoNajeeb U Rehman, MD;  Location: [facility];  Service: Gastroenterology;  Laterality: N/A;  . Foreign body removal N/A 08/18/2012    Procedure: FOREIGN BODY REMOVAL;  Surgeon: Malissa HippoNajeeb U Rehman, MD;  Location: [facility];  Service: Gastroenterology;  Laterality: N/A;    History reviewed. No pertinent family history.  Social History Social History  Substance Use Topics  . Smoking status: Former Smoker    Quit  date: 06/23/2012  . Smokeless tobacco: None  . Alcohol Use: Yes     Comment: occasional    Allergies  Allergen Reactions  . Fish Allergy Nausea And Vomiting    Current Outpatient Prescriptions  Medication Sig Dispense Refill  . buprenorphine-naloxone (SUBOXONE) 8-2 MG SUBL SL tablet Place 1 tablet under the tongue daily.    Marland Kitchen. HYDROcodone-acetaminophen (NORCO/VICODIN) 5-325 MG tablet One by mouth every four hours as needed for pain.  Must last 15 days.  Do no drive a car when on this medicine. 60 tablet 0  . naproxen (NAPROSYN) 500 MG tablet Take 1 tablet (500 mg total) by mouth 2 (two) times daily with a meal. 60 tablet 5   No current facility-administered medications for this visit.     Physical Exam  Blood pressure 128/79, pulse 89, temperature 97.9 F (36.6 C), height 5\' 7"  (1.702 m), weight 196 lb (88.905 kg).  Constitutional: overall normal hygiene, normal nutrition, well developed, normal grooming, normal body habitus. Assistive device:none  Musculoskeletal: gait and station Limp none, muscle tone and strength are normal, no tremors or atrophy is present.  .  Neurological: coordination overall normal.  Deep tendon reflex/nerve stretch intact.  Sensation normal.  Cranial nerves II-XII intact.   Skin:   normal overall no scars, lesions, ulcers or rashes. No psoriasis.  Psychiatric: Alert and oriented x 3.  Recent memory intact, remote memory unclear.  Normal mood and affect. Well groomed.  Good eye contact.  Cardiovascular: overall no swelling, no varicosities, no edema bilaterally, normal temperatures of the legs and arms, no clubbing, cyanosis and good capillary refill.  Lymphatic: palpation is normal.  Left shoulder normal.  Examination of right Upper Extremity is done.  Inspection:   Overall:  Elbow non-tender without crepitus or defects, forearm non-tender without crepitus or defects, wrist non-tender without crepitus or defects, hand non-tender.    Shoulder:  with glenohumeral joint tenderness, without effusion.   Upper arm: without swelling and tenderness   Range of motion:   Overall:  Full range of motion of the elbow, full range of motion of wrist and full range of motion in fingers.   Shoulder:  bilaterally  180 degrees forward flexion; 180 degrees abduction; 40 degrees internal rotation, 40 degrees external rotation, 25 degrees extension, 40 degrees adduction.  He has pain to resisted abduction on the right.   Stability:   Overall:  Shoulder, elbow and wrist stable   Strength and Tone:   Overall full shoulder muscles strength, full upper arm strength and normal upper arm bulk and tone.   The patient has been educated about the nature of the problem(s) and counseled on treatment options.  The patient appeared to understand what I have discussed and is in agreement with it.  Encounter Diagnosis  Name Primary?  . Right shoulder pain Yes    PLAN Call if any problems.  Precautions discussed.  Continue current medications.   Return to clinic 1 month   Go to OT, do exercises.

## 2015-08-17 ENCOUNTER — Ambulatory Visit (HOSPITAL_COMMUNITY): Payer: BLUE CROSS/BLUE SHIELD | Attending: Orthopaedic Surgery | Admitting: Occupational Therapy

## 2015-08-20 ENCOUNTER — Telehealth (HOSPITAL_COMMUNITY): Payer: Self-pay | Admitting: Specialist

## 2015-08-20 NOTE — Telephone Encounter (Signed)
Patient was a no show for his 6/13 appointment.  I called to reschedule and he asked that therapy be put on hold as he is very busy with a baseball league.  Patient will call us back if he decides to attend therapy. Shirlean MylarBethany H. Murray, OTR/L [phone]

## 2016-09-12 ENCOUNTER — Encounter (HOSPITAL_COMMUNITY): Payer: Self-pay | Admitting: Emergency Medicine

## 2016-09-12 ENCOUNTER — Emergency Department (HOSPITAL_COMMUNITY)
Admission: EM | Admit: 2016-09-12 | Discharge: 2016-09-12 | Disposition: A | Discharge status: Home / Self Care | Payer: BLUE CROSS/BLUE SHIELD | Attending: Emergency Medicine | Admitting: Emergency Medicine

## 2016-09-12 DIAGNOSIS — Y999 Unspecified external cause status: Secondary | ICD-10-CM | POA: Insufficient documentation

## 2016-09-12 DIAGNOSIS — S0502XA Injury of conjunctiva and corneal abrasion without foreign body, left eye, initial encounter: Secondary | ICD-10-CM

## 2016-09-12 DIAGNOSIS — Z79899 Other long term (current) drug therapy: Secondary | ICD-10-CM | POA: Insufficient documentation

## 2016-09-12 DIAGNOSIS — S0532XA Ocular laceration without prolapse or loss of intraocular tissue, left eye, initial encounter: Secondary | ICD-10-CM | POA: Diagnosis not present

## 2016-09-12 DIAGNOSIS — Y9389 Activity, other specified: Secondary | ICD-10-CM | POA: Insufficient documentation

## 2016-09-12 DIAGNOSIS — W228XXA Striking against or struck by other objects, initial encounter: Secondary | ICD-10-CM | POA: Diagnosis not present

## 2016-09-12 DIAGNOSIS — F17220 Nicotine dependence, chewing tobacco, uncomplicated: Secondary | ICD-10-CM | POA: Diagnosis not present

## 2016-09-12 DIAGNOSIS — J45909 Unspecified asthma, uncomplicated: Secondary | ICD-10-CM | POA: Insufficient documentation

## 2016-09-12 DIAGNOSIS — S0592XA Unspecified injury of left eye and orbit, initial encounter: Secondary | ICD-10-CM | POA: Diagnosis present

## 2016-09-12 DIAGNOSIS — Y92410 Unspecified street and highway as the place of occurrence of the external cause: Secondary | ICD-10-CM | POA: Diagnosis not present

## 2016-09-12 MED ORDER — FLUORESCEIN SODIUM 0.6 MG OP STRP
ORAL_STRIP | OPHTHALMIC | Status: AC
  Filled 2016-09-12: qty 1

## 2016-09-12 MED ORDER — TETANUS-DIPHTH-ACELL PERTUSSIS 5-2.5-18.5 LF-MCG/0.5 IM SUSP: 0.5000 mL | Freq: Once | INTRAMUSCULAR | Status: DC

## 2016-09-12 MED ORDER — TOBRAMYCIN 0.3 % OP SOLN
1.0000 [drp] | Freq: Once | OPHTHALMIC | Status: AC
  Administered 2016-09-12: 1 [drp] via OPHTHALMIC
  Filled 2016-09-12: qty 5

## 2016-09-12 MED ORDER — TETRACAINE HCL 0.5 % OP SOLN
OPHTHALMIC | Status: AC
  Filled 2016-09-12: qty 4

## 2016-09-12 NOTE — ED Notes (Signed)
Pt refused a tetanus shot earlier, asked again about taking tetanus shot and pt continuously refusing tetanus shot.  When this nurse went in to review d/c instructions, pt upset "Ronald Randall Atlasve been here for this long and they can't tell me if I have anything in my eye or not. "

## 2016-09-12 NOTE — ED Triage Notes (Signed)
Pt works [employer], [employer] piece [employer].  No loss of vision

## 2016-09-12 NOTE — ED Notes (Signed)
Pt refused visual acuity screening at this time.  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

## 2016-09-12 NOTE — ED Notes (Signed)
Pt believes last tetanus shot within 5 years.

## 2016-09-12 NOTE — ED Provider Notes (Signed)
[facility] Provider Note   CSN: [id number] Arrival date & time: 09/12/16  1706     History   Chief Complaint Chief Complaint  Patient presents with  . Eye Injury    HPI Ronald Randall is a 36 y.o. male.  The history is provided by the patient.  Eye Injury  This is a new problem. The current episode started 1 to 2 hours ago. The problem occurs constantly. The problem has not changed since onset.Pertinent negatives include no headaches. Exacerbated by: blinking. Nothing relieves the symptoms. He has tried water for the symptoms. The treatment provided no relief.   Pt with a piece of sawdust in his eye that blew in when he was driving in his car. Persistent pain and foreign body sensation left lateral eye which is stationary and worsened with blinking.  He denies any visual acuity changes except for blurring when he tears.    Past Medical History:  Diagnosis Date  . Asthma   . Kidney stones     There are no active problems to display for this patient.   Past Surgical History:  Procedure Laterality Date  . ESOPHAGOGASTRODUODENOSCOPY (EGD) WITH PROPOFOL N/A 08/18/2012   Procedure: ESOPHAGOGASTRODUODENOSCOPY (EGD) WITH PROPOFOL;  Surgeon: Malissa HippoNajeeb U Rehman, MD;  Location: [facility];  Service: Gastroenterology;  Laterality: N/A;  . FOREIGN BODY REMOVAL N/A 08/18/2012   Procedure: FOREIGN BODY REMOVAL;  Surgeon: Malissa HippoNajeeb U Rehman, MD;  Location: [facility];  Service: Gastroenterology;  Laterality: N/A;       Home Medications    Prior to Admission medications   Medication Sig Start Date End Date Taking? Authorizing Provider  buprenorphine-naloxone (SUBOXONE) 8-2 MG SUBL SL tablet Place 1 tablet under the tongue daily.    [provider]  HYDROcodone-acetaminophen (NORCO/VICODIN) 5-325 MG tablet One by mouth every four hours as needed for pain.  Must last 15 days.  Do no drive a car when on this medicine. 07/06/15   Darreld McleanKeeling, Wayne, MD  naproxen (NAPROSYN) 500 MG tablet Take 1  tablet (500 mg total) by mouth 2 (two) times daily with a meal. 07/06/15   Darreld McleanKeeling, Wayne, MD    Family History No family history on file.  Social History Social History  Substance Use Topics  . Smoking status: Former Smoker    Quit date: 06/23/2012  . Smokeless tobacco: Current User    Types: Chew  . Alcohol use Yes     Comment: occasional     Allergies   Fish allergy   Review of Systems Review of Systems  Constitutional: Negative.  Negative for fever.  HENT: Negative.   Eyes: Positive for pain. Negative for photophobia, redness and visual disturbance.  Respiratory: Negative.   Gastrointestinal: Negative for nausea.  Musculoskeletal: Negative.   Skin: Negative.  Negative for rash and wound.  Neurological: Negative for headaches.  Psychiatric/Behavioral: Negative.      Physical Exam Updated Vital Signs BP (!) 123/107 (BP Location: Right Arm)   Pulse (!) 116   Temp 98.7 F (37.1 C)   Resp 16   Wt 88.9 kg (196 lb)   SpO2 100%   BMI 30.70 kg/m   Physical Exam  Constitutional: He appears well-developed and well-nourished.  HENT:  Head: Normocephalic and atraumatic.  Eyes: EOM are normal. Pupils are equal, round, and reactive to light. Left conjunctiva is injected.  Slit lamp exam:      The left eye shows corneal abrasion and fluorescein uptake. The left eye shows no corneal flare.  Superficial appearing corneal abrasion left eye centrally with a tiny crescent shaped disruption in the cornea around the 2-3 o'clock position. Dark center to this disruption but no obvious foreign body.    Several swipes with a qtip with no movement, no resistance suggesting fb.   Neck: Normal range of motion.  Cardiovascular: Normal rate, regular rhythm, normal heart sounds and intact distal pulses.   Pulmonary/Chest: Effort normal and breath sounds normal.  Musculoskeletal: Normal range of motion.  Neurological: He is alert.  Skin: Skin is warm and dry.  Psychiatric: He has a  normal mood and affect.  Nursing note and vitals reviewed.    ED Treatments / Results  Labs (all labs ordered are listed, but only abnormal results are displayed) Labs Reviewed - No data to display  EKG  EKG Interpretation None       Radiology No results found.  Procedures Procedures (including critical care time)  Medications Ordered in ED Medications  tetracaine (PONTOCAINE) 0.5 % ophthalmic solution (not administered)  fluorescein 0.6 MG ophthalmic strip (not administered)  tobramycin (TOBREX) 0.3 % ophthalmic solution 1 drop (not administered)  Tdap (BOOSTRIX) injection 0.5 mL (not administered)     Initial Impression / Assessment and Plan / ED Course  I have reviewed the triage vital signs and the nursing notes.  Pertinent labs & imaging results that were available during my care of the patient were reviewed by me and considered in my medical decision making (see chart for details).     Pt was also seen by Dr Adriana Simas who agrees with uncertainty of retained fb.  Discussed with Dr.Patel who will see pt in office tomorrow - pt to call for appt time. He was placed on tobrex gtts, given.  Pt refused visual acuity today stating he is "going for his cdl's" and does not want any record of vision problems. Again, denies visual change with todays injury.  Pt refused tetanus update.  Final Clinical Impressions(s) / ED Diagnoses   Final diagnoses:  Abrasion of left cornea, initial encounter  Corneal laceration of left eye, initial encounter    New Prescriptions New Prescriptions   No medications on file     Victoriano Lain 09/12/16 1949    Burgess Amor, PA-C 09/12/16 1952    Donnetta Hutching, MD 09/13/16 1750

## 2016-09-12 NOTE — Discharge Instructions (Signed)
You need to have a recheck of your eye by an ophthalmologist as discussed tomorrow - call Dr Eliane DecreePatel's office for an appointment time.  In the meantime, avoid rubbing or touching your eye.  Apply 1 drop of the antibiotic given every 4 hours.  It is unclear by tonights exam if you have simply a small laceration of your eye in addition to the abrasion or if there is also a foreign body deep within the laceration itself.  Dr. Allena KatzPatel will be able to determine this and provide ongoing care as your eye heals.

## 2017-06-25 ENCOUNTER — Emergency Department (HOSPITAL_COMMUNITY)
Admission: EM | Admit: 2017-06-25 | Discharge: 2017-06-25 | Disposition: A | Discharge status: Home / Self Care | Payer: BLUE CROSS/BLUE SHIELD | Attending: Emergency Medicine | Admitting: Emergency Medicine

## 2017-06-25 ENCOUNTER — Encounter (HOSPITAL_COMMUNITY): Payer: Self-pay | Admitting: Emergency Medicine

## 2017-06-25 ENCOUNTER — Emergency Department (HOSPITAL_COMMUNITY): Payer: BLUE CROSS/BLUE SHIELD

## 2017-06-25 ENCOUNTER — Other Ambulatory Visit: Payer: Self-pay

## 2017-06-25 DIAGNOSIS — R110 Nausea: Secondary | ICD-10-CM | POA: Diagnosis not present

## 2017-06-25 DIAGNOSIS — N201 Calculus of ureter: Secondary | ICD-10-CM | POA: Diagnosis not present

## 2017-06-25 DIAGNOSIS — J45909 Unspecified asthma, uncomplicated: Secondary | ICD-10-CM | POA: Insufficient documentation

## 2017-06-25 DIAGNOSIS — F17220 Nicotine dependence, chewing tobacco, uncomplicated: Secondary | ICD-10-CM | POA: Diagnosis not present

## 2017-06-25 DIAGNOSIS — R109 Unspecified abdominal pain: Secondary | ICD-10-CM | POA: Diagnosis present

## 2017-06-25 LAB — URINALYSIS, ROUTINE W REFLEX MICROSCOPIC
BACTERIA UA: NONE SEEN
BILIRUBIN URINE: NEGATIVE
Glucose, UA: NEGATIVE mg/dL
KETONES UR: NEGATIVE mg/dL
LEUKOCYTES UA: NEGATIVE
NITRITE: NEGATIVE
Protein, ur: NEGATIVE mg/dL
Specific Gravity, Urine: 1.024 (ref 1.005–1.030)
pH: 6 (ref 5.0–8.0)

## 2017-06-25 MED ORDER — KETOROLAC TROMETHAMINE 30 MG/ML IJ SOLN
30.0000 mg | Freq: Once | INTRAMUSCULAR | Status: AC
  Administered 2017-06-25: 30 mg via INTRAVENOUS
  Filled 2017-06-25: qty 1

## 2017-06-25 MED ORDER — TAMSULOSIN HCL 0.4 MG PO CAPS: 0.4000 mg | ORAL_CAPSULE | Freq: Every day | ORAL | 0 refills | Status: AC

## 2017-06-25 MED ORDER — ONDANSETRON HCL 4 MG/2ML IJ SOLN
4.0000 mg | Freq: Once | INTRAMUSCULAR | Status: AC
  Administered 2017-06-25: 4 mg via INTRAVENOUS
  Filled 2017-06-25: qty 2

## 2017-06-25 MED ORDER — OXYCODONE-ACETAMINOPHEN 5-325 MG PO TABS: 1.0000 | ORAL_TABLET | ORAL | 0 refills | Status: AC | PRN

## 2017-06-25 MED ORDER — HYDROMORPHONE HCL 1 MG/ML IJ SOLN
1.0000 mg | Freq: Once | INTRAMUSCULAR | Status: AC
  Administered 2017-06-25: 1 mg via INTRAVENOUS
  Filled 2017-06-25: qty 1

## 2017-06-25 MED ORDER — ONDANSETRON 8 MG PO TBDP
8.0000 mg | ORAL_TABLET | Freq: Three times a day (TID) | ORAL | 0 refills | Status: AC | PRN
Start: 2017-06-25 — End: ?

## 2017-06-25 NOTE — ED Provider Notes (Signed)
[facility] EMERGENCY DEPARTMENT Provider Note   CSN: [id number] Arrival date & time: 06/25/17  1001     History   Chief Complaint No chief complaint on file.   HPI Ronald Randall is a 37 y.o. male with a  Distant history of kidney stone presenting with severe left sided flank pain which woke him from sleep early today.  He denies urinary pain or bloody urine but has had nausea without vomiting,no fevers.  He reports feeling a little left sided back discomfort yesterday, but fleeting and resolved until this morning.  He has had no treatment prior to arrival.  The history is provided by the patient.    Past Medical History:  Diagnosis Date  . Asthma   . Kidney stones     There are no active problems to display for this patient.   Past Surgical History:  Procedure Laterality Date  . ESOPHAGOGASTRODUODENOSCOPY (EGD) WITH PROPOFOL N/A 08/18/2012   Procedure: ESOPHAGOGASTRODUODENOSCOPY (EGD) WITH PROPOFOL;  Surgeon: Malissa Hippo, MD;  Location: [facility];  Service: Gastroenterology;  Laterality: N/A;  . FOREIGN BODY REMOVAL N/A 08/18/2012   Procedure: FOREIGN BODY REMOVAL;  Surgeon: Malissa Hippo, MD;  Location: [facility];  Service: Gastroenterology;  Laterality: N/A;        Home Medications    Prior to Admission medications   Medication Sig Start Date End Date Taking? Authorizing Provider  diphenhydrAMINE (SOMINEX) 25 MG tablet Take 25 mg by mouth at bedtime as needed for sleep.   Yes [provider]  HYDROcodone-acetaminophen (NORCO/VICODIN) 5-325 MG tablet One by mouth every four hours as needed for pain.  Must last 15 days.  Do no drive a car when on this medicine. 07/06/15   Darreld Mclean, MD  ondansetron (ZOFRAN ODT) 8 MG disintegrating tablet Take 1 tablet (8 mg total) by mouth every 8 (eight) hours as needed for nausea or vomiting. 06/25/17   Adair Lauderback, Raynelle Fanning, PA-C  oxyCODONE-acetaminophen (PERCOCET/ROXICET) 5-325 MG tablet Take 1 tablet by mouth every 4 (four) hours as  needed. 06/25/17   Burgess Amor, PA-C  tamsulosin (FLOMAX) 0.4 MG CAPS capsule Take 1 capsule (0.4 mg total) by mouth daily after supper. 06/25/17   Burgess Amor, PA-C    Family History History reviewed. No pertinent family history.  Social History Social History   Tobacco Use  . Smoking status: Former Smoker    Last attempt to quit: 06/23/2012    Years since quitting: 5.0  . Smokeless tobacco: Current User    Types: Chew  Substance Use Topics  . Alcohol use: Yes    Comment: occasional  . Drug use: No     Allergies   Fish allergy   Review of Systems Review of Systems  Constitutional: Negative for fever.  HENT: Negative for congestion and sore throat.   Eyes: Negative.   Respiratory: Negative for chest tightness and shortness of breath.   Cardiovascular: Negative for chest pain.  Gastrointestinal: Negative for abdominal pain and nausea.  Genitourinary: Positive for flank pain. Negative for dysuria, penile pain, penile swelling, scrotal swelling and urgency.  Musculoskeletal: Negative for arthralgias, joint swelling and neck pain.  Skin: Negative.  Negative for rash and wound.  Neurological: Negative for dizziness, weakness, light-headedness, numbness and headaches.  Psychiatric/Behavioral: Negative.      Physical Exam Updated Vital Signs BP 119/78   Pulse (!) 52   Temp 98.7 F (37.1 C) (Oral)   Resp (!) 22   Wt 90.7 kg (200 lb)   SpO2  96%   BMI 31.32 kg/m   Physical Exam  Constitutional: He appears well-developed and well-nourished.  HENT:  Head: Normocephalic and atraumatic.  Neck: Normal range of motion.  Cardiovascular: Normal rate, regular rhythm, normal heart sounds and intact distal pulses.  Pulmonary/Chest: Effort normal and breath sounds normal. He has no wheezes.  Abdominal: Soft. Bowel sounds are normal. There is no tenderness. There is CVA tenderness.  Musculoskeletal: Normal range of motion.  Neurological: He is alert.  Skin: Skin is warm and  dry.  Psychiatric: He has a normal mood and affect.  Nursing note and vitals reviewed.    ED Treatments / Results  Labs (all labs ordered are listed, but only abnormal results are displayed) Labs Reviewed  URINALYSIS, ROUTINE W REFLEX MICROSCOPIC - Abnormal; Notable for the following components:      Result Value   Hgb urine dipstick LARGE (*)    Squamous Epithelial / LPF 0-5 (*)    All other components within normal limits    EKG None  Radiology Ct Renal Stone Study  Result Date: 06/25/2017 CLINICAL DATA:  Left flank pain EXAM: CT ABDOMEN AND PELVIS WITHOUT CONTRAST TECHNIQUE: Multidetector CT imaging of the abdomen and pelvis was performed following the standard protocol without oral or IV contrast. COMPARISON:  May 29, 2007 FINDINGS: Lower chest: Lung bases are clear.  There is a small hiatal hernia. Hepatobiliary: No focal liver lesions are appreciable on this noncontrast enhanced study. Gallbladder wall is not appreciably thickened. There is no biliary duct dilatation. Pancreas: There is no pancreatic mass or inflammatory focus. Spleen: No splenic lesions evident. Adrenals/Urinary Tract: Adrenals appear normal bilaterally. There is no renal mass on either side. There is perinephric stranding on the left, however. There is no hydronephrosis on the right. There is moderate hydronephrosis on the left. There is no intrarenal calculus on either side. There is a calculus at the left ureterovesical junction measuring 4 x 3 mm. No other ureteral calculi identified on either side. Urinary bladder is midline with wall thickness within normal limits. Stomach/Bowel: There is no appreciable bowel wall or mesenteric thickening. No bowel obstruction. No free air or portal venous air. Vascular/Lymphatic: There is mild atherosclerotic calcification in the aorta. No aneurysm. Major mesenteric vessels appear patent. There is no appreciable adenopathy in the abdomen or pelvis. Reproductive: Prostate and  seminal vesicles appear normal in size and contour. No evident pelvic mass. Other: The appendix appears normal. There is no abscess or ascites in the abdomen or pelvis. There is a minimal ventral hernia containing only fat. Musculoskeletal: There are no blastic or lytic bone lesions. There is no intramuscular or abdominal wall lesion. IMPRESSION: 1. 4 x 3 mm calculus at the left ureterovesical junction with moderate hydronephrosis on the left. Mild perinephric stranding on the left. 2.  No bowel obstruction.  No abscess.  Appendix appears normal. 3. Small hiatal hernia. Minimal ventral hernia containing only fat. Electronically Signed   By: Bretta BangWilliam  Woodruff III M.D.   On: 06/25/2017 12:10    Procedures Procedures (including critical care time)  Medications Ordered in ED Medications  HYDROmorphone (DILAUDID) injection 1 mg (1 mg Intravenous Given 06/25/17 1130)  ondansetron (ZOFRAN) injection 4 mg (4 mg Intravenous Given 06/25/17 1130)  ketorolac (TORADOL) 30 MG/ML injection 30 mg (30 mg Intravenous Given 06/25/17 1246)     Initial Impression / Assessment and Plan / ED Course  I have reviewed the triage vital signs and the nursing notes.  Pertinent labs & imaging  results that were available during my care of the patient were reviewed by me and considered in my medical decision making (see chart for details).     Review of [place] narcotic database.  Pt has not been on suboxone since 2017.  He was given small quantity of oxycodone given severe pain, felt it was indicated for this 4 mm ureteral stone.  Zofran, flomax also prescribed.  Urine strainer given, strict return precautions discussed.  Referral to Dr. Ronne Binning with urology.  Final Clinical Impressions(s) / ED Diagnoses   Final diagnoses:  Left ureteral stone    ED Discharge Orders        Ordered    oxyCODONE-acetaminophen (PERCOCET/ROXICET) 5-325 MG tablet  Every 4 hours PRN     06/25/17 1354    ondansetron (ZOFRAN ODT) 8 MG  disintegrating tablet  Every 8 hours PRN     06/25/17 1354    tamsulosin (FLOMAX) 0.4 MG CAPS capsule  Daily after supper     06/25/17 1354       Burgess Amor, PA-C 06/25/17 1401    Doug Sou, MD 06/25/17 1550

## 2017-06-25 NOTE — ED Notes (Signed)
Pt told to urinate through strainer into urinal.

## 2017-06-25 NOTE — ED Triage Notes (Signed)
Left Flank pain started this morning history of kidney stones

## 2017-06-25 NOTE — Discharge Instructions (Addendum)
Make sure to drink plenty of fluids. You may take the oxycodone prescribed for pain relief.  This will make you drowsy - do not drive within 4 hours of taking this medication.  Take your first dose of Flomax this evening, as discussed this may help your kidney stone pass quicker and easier.  Strain your urine so that you know when the stone has passed.  Return here for any fevers, worse pain or uncontrolled vomiting.  Call Dr. Ronne BinningMcKenzie for further evaluation and management of this kidney stone.

## 2017-06-27 DIAGNOSIS — H5789 Other specified disorders of eye and adnexa: Secondary | ICD-10-CM | POA: Insufficient documentation

## 2017-06-27 DIAGNOSIS — J45909 Unspecified asthma, uncomplicated: Secondary | ICD-10-CM | POA: Insufficient documentation

## 2017-06-27 DIAGNOSIS — Z87891 Personal history of nicotine dependence: Secondary | ICD-10-CM | POA: Diagnosis not present

## 2017-06-28 ENCOUNTER — Encounter (HOSPITAL_COMMUNITY): Payer: Self-pay

## 2017-06-28 ENCOUNTER — Emergency Department (HOSPITAL_COMMUNITY)
Admission: EM | Admit: 2017-06-28 | Discharge: 2017-06-28 | Disposition: A | Discharge status: Home / Self Care | Payer: BLUE CROSS/BLUE SHIELD | Attending: Emergency Medicine | Admitting: Emergency Medicine

## 2017-06-28 DIAGNOSIS — H579 Unspecified disorder of eye and adnexa: Secondary | ICD-10-CM

## 2017-06-28 MED ORDER — FLUORESCEIN SODIUM 1 MG OP STRP
ORAL_STRIP | OPHTHALMIC | Status: AC
  Filled 2017-06-28: qty 1

## 2017-06-28 MED ORDER — TETRACAINE HCL 0.5 % OP SOLN
OPHTHALMIC | Status: AC
  Filled 2017-06-28: qty 4

## 2017-06-28 NOTE — ED Notes (Signed)
Pt left ED room 5, telling this nurse that he was going to another hospital because he couldn't deal with this. Pt refused signing d/c and refused vitals. MD notified.

## 2017-06-28 NOTE — Discharge Instructions (Addendum)
Follow up with Ophthalmology this morning.  The contact information for Dr. Harvel QualeAbugo has provided in this discharge summary for you to call and make these arrangements.

## 2017-06-28 NOTE — Progress Notes (Signed)
Pt observed looking at phone during eye exam. Steward DroneBrenda, RN had her back turned pointing at sign and did not appear to see pt on his phone. MD made aware

## 2017-06-28 NOTE — ED Triage Notes (Signed)
Pt states he thinks he has a piece of metal in his right eye.  Pt does not remember anything flying up into his eye, but has irritation, states "the piece keeps moving around"

## 2017-06-28 NOTE — ED Provider Notes (Signed)
[facility] EMERGENCY DEPARTMENT Provider Note   CSN: [id number] Arrival date & time: 06/27/17  2327     History   Chief Complaint Chief Complaint  Patient presents with  . Foreign Body in Eye    HPI Justyce Urbanek is a 37 y.o. male.  Patient is a 37 year old male presenting with complaints of foreign body in his right eye.  He tells me as he was getting out of the shower this evening discomfort in the right eye.  He insists that there is a foreign object under his right eyelid.  Him and his wife both are able to see this in the mirror.  She attempted to remove it, however was unsuccessful.  The history is provided by the patient.  Foreign Body in Eye  This is a new problem. The current episode started 1 to 2 hours ago. The problem occurs constantly. The problem has been rapidly worsening. Nothing aggravates the symptoms. Nothing relieves the symptoms. He has tried nothing for the symptoms.    Past Medical History:  Diagnosis Date  . Asthma   . Kidney stones     There are no active problems to display for this patient.   Past Surgical History:  Procedure Laterality Date  . ESOPHAGOGASTRODUODENOSCOPY (EGD) WITH PROPOFOL N/A 08/18/2012   Procedure: ESOPHAGOGASTRODUODENOSCOPY (EGD) WITH PROPOFOL;  Surgeon: Malissa Hippo, MD;  Location: [facility];  Service: Gastroenterology;  Laterality: N/A;  . FOREIGN BODY REMOVAL N/A 08/18/2012   Procedure: FOREIGN BODY REMOVAL;  Surgeon: Malissa Hippo, MD;  Location: [facility];  Service: Gastroenterology;  Laterality: N/A;        Home Medications    Prior to Admission medications   Medication Sig Start Date End Date Taking? Authorizing Provider  diphenhydrAMINE (SOMINEX) 25 MG tablet Take 25 mg by mouth at bedtime as needed for sleep.    [provider]  HYDROcodone-acetaminophen (NORCO/VICODIN) 5-325 MG tablet One by mouth every four hours as needed for pain.  Must last 15 days.  Do no drive a car when on this medicine. 07/06/15    Darreld Mclean, MD  ondansetron (ZOFRAN ODT) 8 MG disintegrating tablet Take 1 tablet (8 mg total) by mouth every 8 (eight) hours as needed for nausea or vomiting. 06/25/17   Idol, Raynelle Fanning, PA-C  oxyCODONE-acetaminophen (PERCOCET/ROXICET) 5-325 MG tablet Take 1 tablet by mouth every 4 (four) hours as needed. 06/25/17   Burgess Amor, PA-C  tamsulosin (FLOMAX) 0.4 MG CAPS capsule Take 1 capsule (0.4 mg total) by mouth daily after supper. 06/25/17   Burgess Amor, PA-C    Family History No family history on file.  Social History Social History   Tobacco Use  . Smoking status: Former Smoker    Last attempt to quit: 06/23/2012    Years since quitting: 5.0  . Smokeless tobacco: Current User    Types: Chew  Substance Use Topics  . Alcohol use: Yes    Comment: occasional  . Drug use: No     Allergies   Fish allergy   Review of Systems Review of Systems  All other systems reviewed and are negative.    Physical Exam Updated Vital Signs BP (!) 168/98 (BP Location: Left Arm)   Pulse (!) 108   Temp 99 F (37.2 C) (Oral)   Resp 16   Ht 5\' 7"  (1.702 m)   Wt 90.7 kg (200 lb)   SpO2 98%   BMI 31.32 kg/m   Physical Exam  Constitutional: He is oriented to  person, place, and time. He appears well-developed and well-nourished. No distress.  HENT:  Head: Normocephalic and atraumatic.  Eyes:  The right eye appears grossly normal.  There is no abrasion or foreign body noted to visual inspection or with fluorescein staining.  Both lids were everted and I am unable to identify any foreign object.  Neck: Normal range of motion. Neck supple.  Pulmonary/Chest: Effort normal.  Neurological: He is alert and oriented to person, place, and time.  Skin: Skin is warm and dry. He is not diaphoretic.  Nursing note and vitals reviewed.    ED Treatments / Results  Labs (all labs ordered are listed, but only abnormal results are displayed) Labs Reviewed - No data to  display  EKG None  Radiology No results found.  Procedures Procedures (including critical care time)  Medications Ordered in ED Medications  fluorescein 1 MG ophthalmic strip (has no administration in time range)  tetracaine (PONTOCAINE) 0.5 % ophthalmic solution (has no administration in time range)     Initial Impression / Assessment and Plan / ED Course  I have reviewed the triage vital signs and the nursing notes.  Pertinent labs & imaging results that were available during my care of the patient were reviewed by me and considered in my medical decision making (see chart for details).  Patient presents with complaints of foreign body under his right eyelid.  He states that he noticed this when getting out of the shower.  He denies any recent grinding or pounding of metal.  He denies any recent injury to the eye.  On exam of the eye, there is no evidence for any foreign body.  The right upper eyelid was inverted and examined on multiple occasions, with no identifiable foreign body noted.  This patient became somewhat annoyed with me as I was unable to see what he insisted was there.  He even took me into the restroom and showed me in the mirror.  I was still unable to see any evidence of foreign body and was unable to see the "black object" that he was referring to.  My plan was for him to follow-up tomorrow with ophthalmology and would give him the number to call to make these arrangements.  The patient ultimately expressed displeasure with the care he received, then eloped from the emergency department prior to signing discharge instructions.  Final Clinical Impressions(s) / ED Diagnoses   Final diagnoses:  None    ED Discharge Orders    None       Geoffery Lyonselo, Kaisyn Millea, MD 06/28/17 [phone]

## 2017-12-30 ENCOUNTER — Other Ambulatory Visit: Payer: Self-pay

## 2017-12-30 ENCOUNTER — Emergency Department (HOSPITAL_COMMUNITY)
Admission: EM | Admit: 2017-12-30 | Discharge: 2017-12-30 | Disposition: A | Discharge status: Home / Self Care | Payer: BLUE CROSS/BLUE SHIELD | Attending: Emergency Medicine | Admitting: Emergency Medicine

## 2017-12-30 ENCOUNTER — Emergency Department (HOSPITAL_COMMUNITY): Payer: BLUE CROSS/BLUE SHIELD

## 2017-12-30 ENCOUNTER — Encounter (HOSPITAL_COMMUNITY): Payer: Self-pay | Admitting: Emergency Medicine

## 2017-12-30 DIAGNOSIS — Z5321 Procedure and treatment not carried out due to patient leaving prior to being seen by health care provider: Secondary | ICD-10-CM | POA: Diagnosis not present

## 2017-12-30 DIAGNOSIS — R0602 Shortness of breath: Secondary | ICD-10-CM | POA: Diagnosis present

## 2017-12-30 NOTE — ED Triage Notes (Signed)
Pt called EMS for Sob. Pt has hx of asthma. Pt has gotten 7.5 albuterol and 0.5 atrovent. Pt also got 125mg  solumedrol en route via EMS. Pt was 87% on RA initially. No cough, no fevers reported.

## 2017-12-30 NOTE — ED Notes (Signed)
PT refused chest xray and stated he wasn't staying for further eval.

## 2019-12-13 IMAGING — CT CT RENAL STONE PROTOCOL
2 of 4 series · 16 of 46 positions shown, 18 images · non-contrast
Comparison: May 29, 2007

CLINICAL DATA: Left flank pain

EXAM:
CT ABDOMEN AND PELVIS WITHOUT CONTRAST
TECHNIQUE: Multidetector CT imaging of the abdomen and pelvis was performed
following the standard protocol without oral or IV contrast.

[Series 2: axial st · axial · 0.76mm/px · z∈[+810,+1235]mm · 13 of 93 slices shown, 15 images]
[im 4/93  soft-tissue]
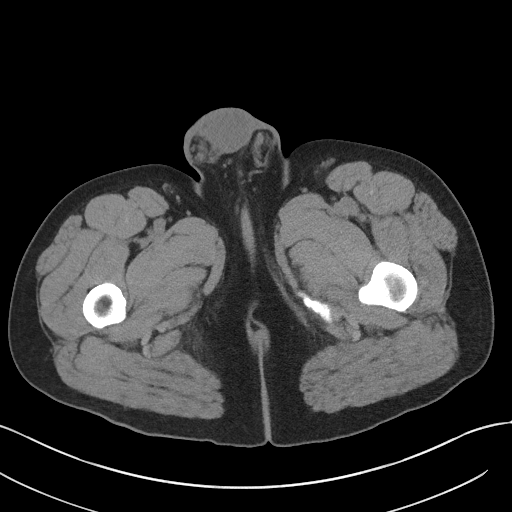
[im 4/93  bone]
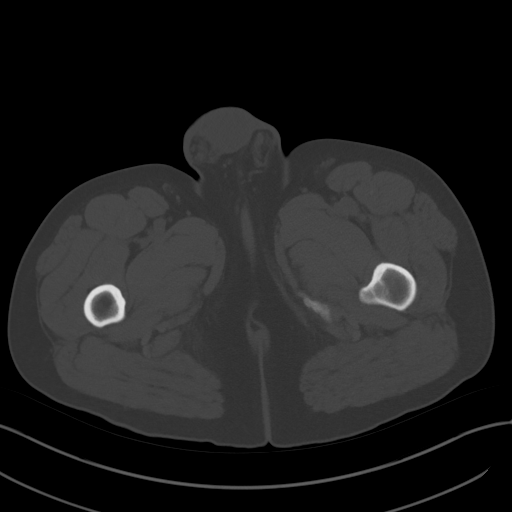
[im 11/93  soft-tissue]
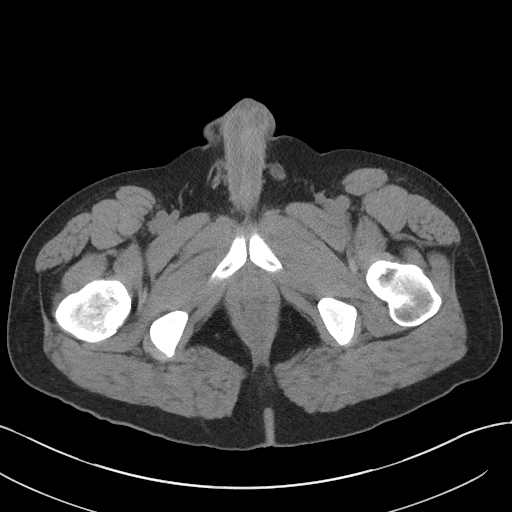
[im 18/93  soft-tissue]
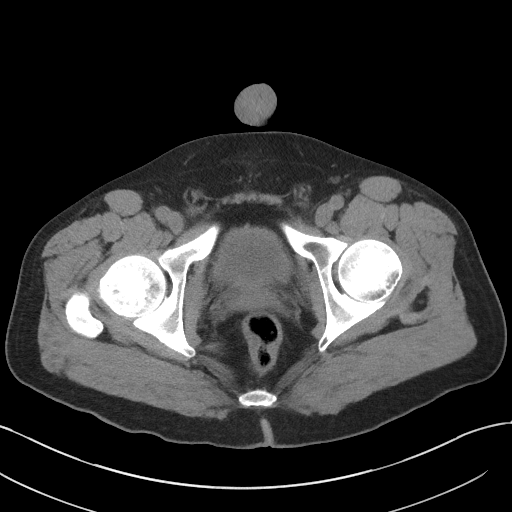
[im 25/93  soft-tissue]
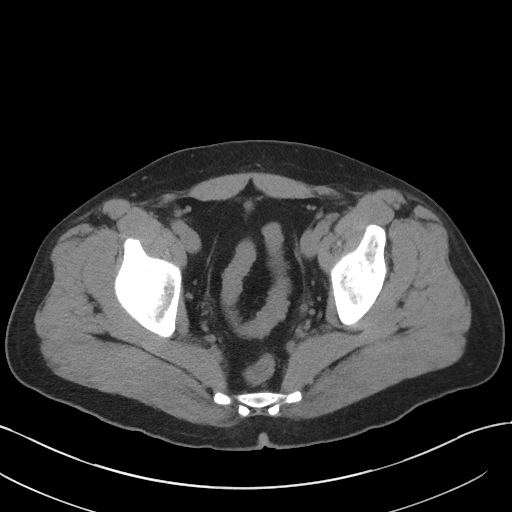
[im 32/93  soft-tissue]
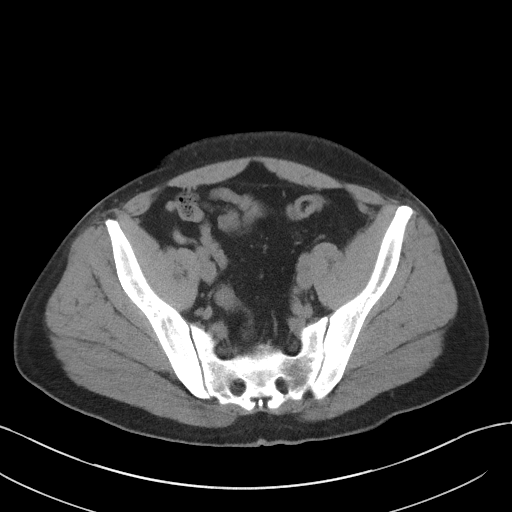
[im 39/93  soft-tissue]
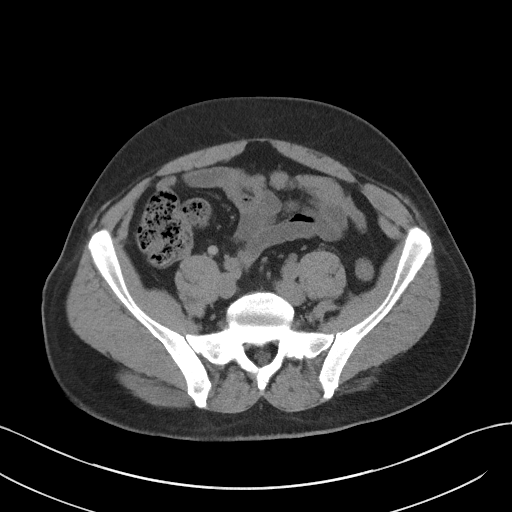
[im 47/93  soft-tissue]
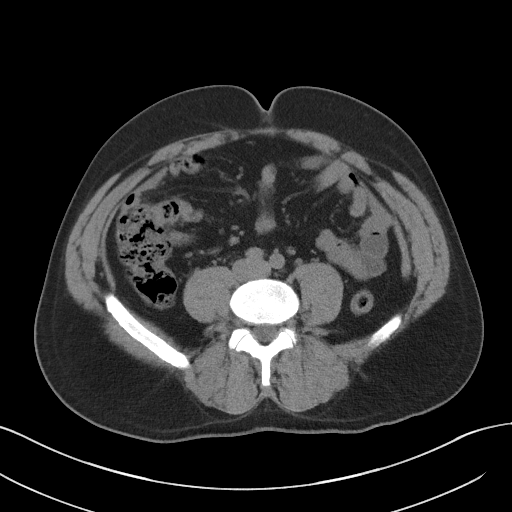
[im 54/93  soft-tissue]
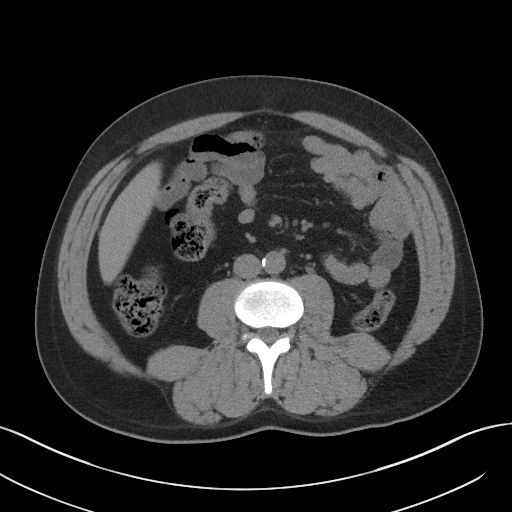
[im 61/93  soft-tissue]
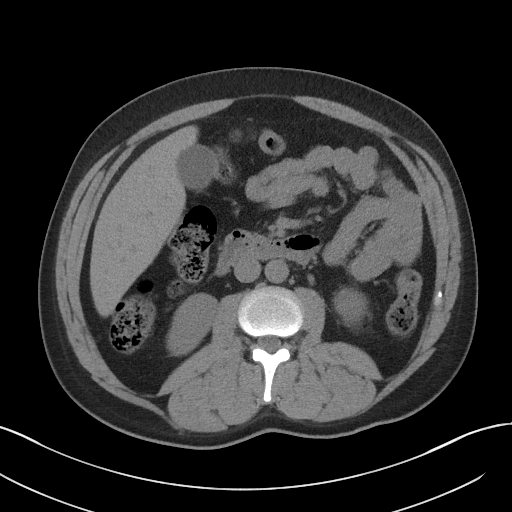
[im 61/93  bone]
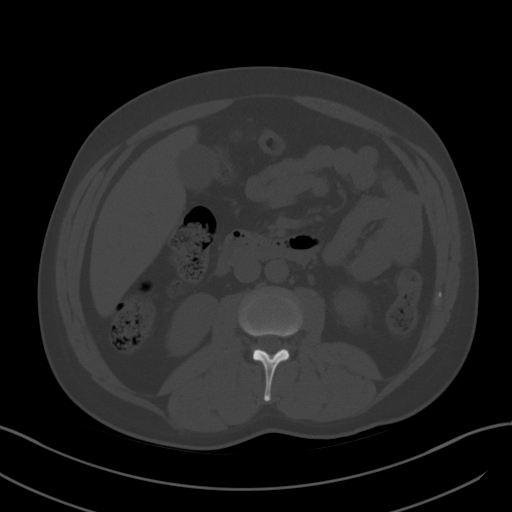
[im 68/93  soft-tissue]
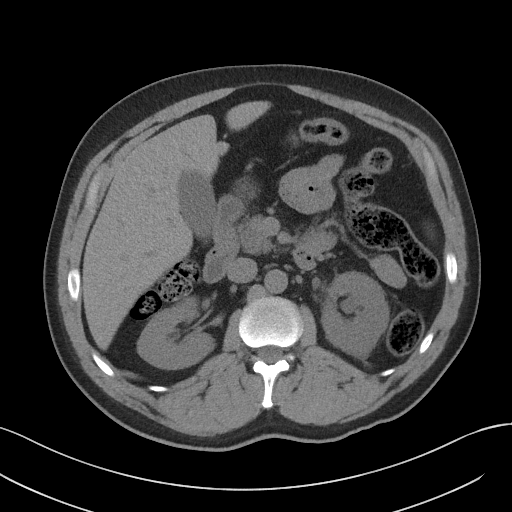
[im 75/93  soft-tissue]
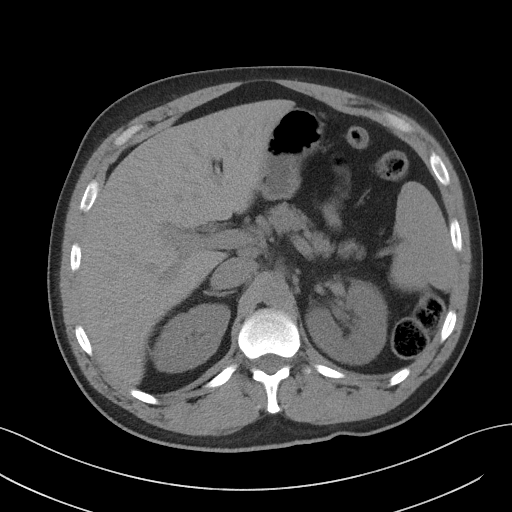
[im 82/93  soft-tissue]
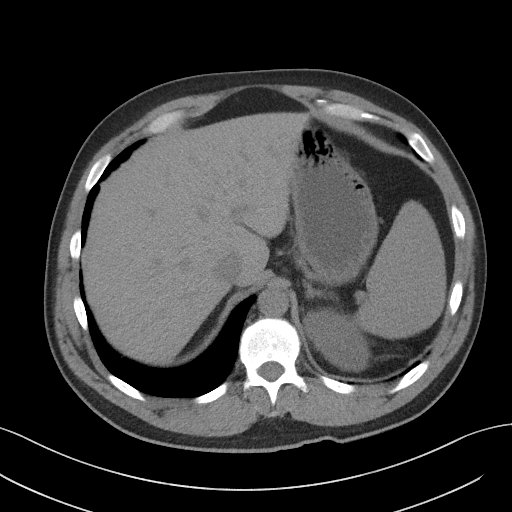
[im 89/93  soft-tissue]
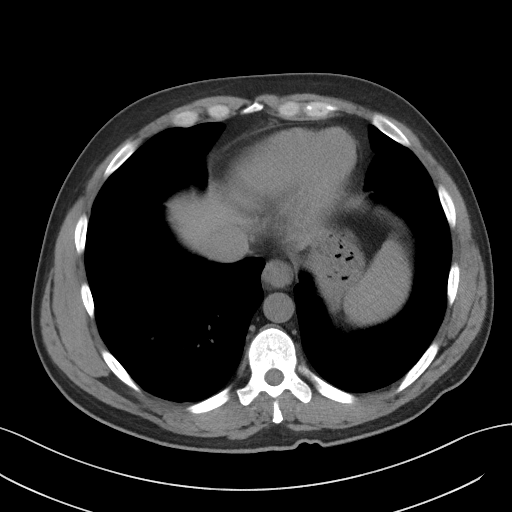

[Series 5: coronal st · coronal · 0.71mm/px · 3 of 101 slices shown]
[im 34/101  soft-tissue]
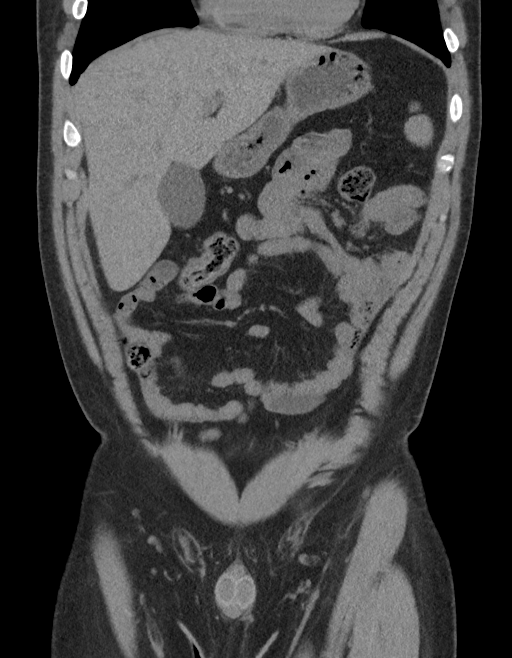
[im 45/101  soft-tissue]
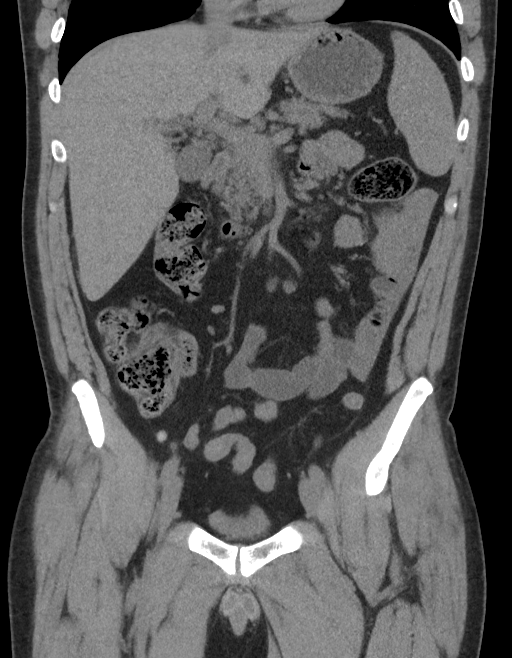
[im 56/101  soft-tissue]
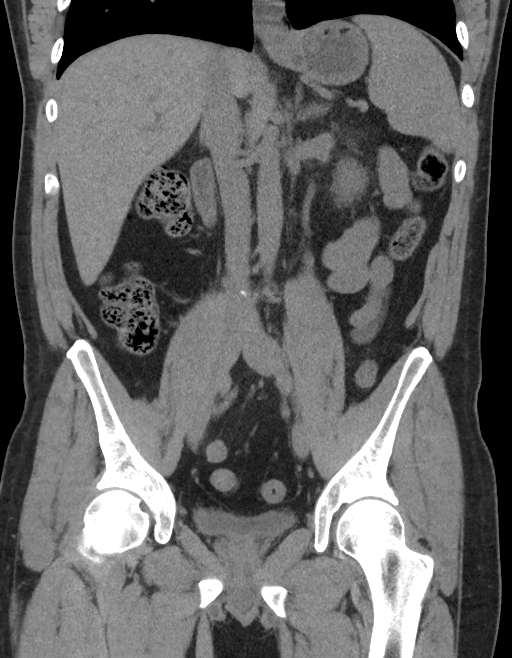

[16 of 46 positions shown; findings below may reference images not displayed]

FINDINGS: Lower chest: Lung bases are clear.  There is a small hiatal hernia.

Hepatobiliary: No focal liver lesions are appreciable on this
noncontrast enhanced study. Gallbladder wall is not appreciably
thickened. There is no biliary duct dilatation.

Pancreas: There is no pancreatic mass or inflammatory focus.

Spleen: No splenic lesions evident.

Adrenals/Urinary Tract: Adrenals appear normal bilaterally. There is
no renal mass on either side. There is perinephric stranding on the
left, however. There is no hydronephrosis on the right. There is
moderate hydronephrosis on the left. There is no intrarenal calculus
on either side. There is a calculus at the left ureterovesical
junction measuring 4 x 3 mm. No other ureteral calculi identified on
either side. Urinary bladder is midline with wall thickness within
normal limits.

Stomach/Bowel: There is no appreciable bowel wall or mesenteric
thickening. No bowel obstruction. No free air or portal venous air.

Vascular/Lymphatic: There is mild atherosclerotic calcification in
the aorta. No aneurysm. Major mesenteric vessels appear patent.
There is no appreciable adenopathy in the abdomen or pelvis.

Reproductive: Prostate and seminal vesicles appear normal in size
and contour. No evident pelvic mass.

Other: The appendix appears normal. There is no abscess or ascites
in the abdomen or pelvis. There is a minimal ventral hernia
containing only fat.

Musculoskeletal: There are no blastic or lytic bone lesions. There
is no intramuscular or abdominal wall lesion.
IMPRESSION: 1. 4 x 3 mm calculus at the left ureterovesical junction with
moderate hydronephrosis on the left. Mild perinephric stranding on
the left.

2.  No bowel obstruction.  No abscess.  Appendix appears normal.

3. Small hiatal hernia. Minimal ventral hernia containing only fat.
